# Patient Record
Sex: Female | Born: 1965 | Race: Black or African American | Hispanic: No | Marital: Married | Smoking: Never smoker
Health system: Southern US, Community
[De-identification: ages and names within clinical notes are randomized; demographics above are authoritative.]

## PROBLEM LIST (undated history)

## (undated) DIAGNOSIS — E559 Vitamin D deficiency, unspecified: Secondary | ICD-10-CM

## (undated) DIAGNOSIS — D649 Anemia, unspecified: Secondary | ICD-10-CM

## (undated) DIAGNOSIS — F329 Major depressive disorder, single episode, unspecified: Secondary | ICD-10-CM

## (undated) DIAGNOSIS — J309 Allergic rhinitis, unspecified: Secondary | ICD-10-CM

## (undated) DIAGNOSIS — R7303 Prediabetes: Secondary | ICD-10-CM

## (undated) DIAGNOSIS — E669 Obesity, unspecified: Secondary | ICD-10-CM

## (undated) DIAGNOSIS — F32A Depression, unspecified: Secondary | ICD-10-CM

## (undated) DIAGNOSIS — E785 Hyperlipidemia, unspecified: Secondary | ICD-10-CM

## (undated) HISTORY — DX: Major depressive disorder, single episode, unspecified: F32.9

## (undated) HISTORY — PX: TOOTH EXTRACTION: SUR596

## (undated) HISTORY — DX: Prediabetes: R73.03

## (undated) HISTORY — DX: Vitamin D deficiency, unspecified: E55.9

## (undated) HISTORY — DX: Depression, unspecified: F32.A

## (undated) HISTORY — DX: Hyperlipidemia, unspecified: E78.5

## (undated) HISTORY — DX: Obesity, unspecified: E66.9

## (undated) HISTORY — DX: Anemia, unspecified: D64.9

## (undated) HISTORY — DX: Allergic rhinitis, unspecified: J30.9

---

## 2006-10-26 ENCOUNTER — Ambulatory Visit: Payer: Self-pay | Admitting: Family Medicine

## 2008-10-07 ENCOUNTER — Ambulatory Visit: Payer: Self-pay | Admitting: General Practice

## 2009-03-18 ENCOUNTER — Ambulatory Visit: Payer: Self-pay | Admitting: Family Medicine

## 2010-03-17 ENCOUNTER — Ambulatory Visit: Payer: Self-pay | Admitting: Family Medicine

## 2012-05-11 ENCOUNTER — Ambulatory Visit: Payer: Self-pay | Admitting: Family Medicine

## 2012-07-16 ENCOUNTER — Ambulatory Visit: Payer: Self-pay | Admitting: Family Medicine

## 2012-07-16 DIAGNOSIS — Z0289 Encounter for other administrative examinations: Secondary | ICD-10-CM

## 2012-08-01 ENCOUNTER — Ambulatory Visit: Payer: Self-pay | Admitting: Adult Health

## 2013-02-12 ENCOUNTER — Encounter: Payer: Self-pay | Admitting: *Deleted

## 2013-02-13 ENCOUNTER — Encounter (INDEPENDENT_AMBULATORY_CARE_PROVIDER_SITE_OTHER): Payer: Self-pay

## 2013-02-13 ENCOUNTER — Encounter: Payer: Self-pay | Admitting: Cardiology

## 2013-02-13 ENCOUNTER — Ambulatory Visit (INDEPENDENT_AMBULATORY_CARE_PROVIDER_SITE_OTHER): Payer: PRIVATE HEALTH INSURANCE | Admitting: Cardiology

## 2013-02-13 VITALS — BP 162/111 | HR 83 | Ht 62.0 in | Wt 230.5 lb

## 2013-02-13 DIAGNOSIS — R0989 Other specified symptoms and signs involving the circulatory and respiratory systems: Secondary | ICD-10-CM

## 2013-02-13 DIAGNOSIS — R000 Tachycardia, unspecified: Secondary | ICD-10-CM

## 2013-02-13 DIAGNOSIS — R079 Chest pain, unspecified: Secondary | ICD-10-CM

## 2013-02-13 DIAGNOSIS — R0609 Other forms of dyspnea: Secondary | ICD-10-CM

## 2013-02-13 DIAGNOSIS — R0789 Other chest pain: Secondary | ICD-10-CM

## 2013-02-13 NOTE — Patient Instructions (Signed)
Your physician has requested that you have a stress echocardiogram. For further information please visit [email or web address]. Please follow instruction sheet as given.    Your physician recommends that you schedule a follow-up appointment in:  With Dr. Herbie BaltimoreHarding after your test

## 2013-02-15 ENCOUNTER — Encounter: Payer: Self-pay | Admitting: Cardiology

## 2013-02-15 DIAGNOSIS — R079 Chest pain, unspecified: Secondary | ICD-10-CM | POA: Insufficient documentation

## 2013-02-15 DIAGNOSIS — R0609 Other forms of dyspnea: Secondary | ICD-10-CM | POA: Insufficient documentation

## 2013-02-15 DIAGNOSIS — E661 Drug-induced obesity: Secondary | ICD-10-CM | POA: Insufficient documentation

## 2013-02-15 DIAGNOSIS — R0789 Other chest pain: Secondary | ICD-10-CM | POA: Insufficient documentation

## 2013-02-15 NOTE — Assessment & Plan Note (Addendum)
This is relatively new onset symptom for her. It is questioned with her history of menorrhagia could she potentially be anemic. But with her essentially metabolic syndrome having prediabetes and hypertension as well as obesity, we need to rule out coronary artery disease. She does not have premature family history, and does not smoke.  I would like to get a good assessment of her cardiac function will also be getting assessment of her ischemic potential. With her obesity, and pendulous breast I don't think she would be a candidate for nuclear stress test due to the high likelihood of breast attenuation, loss of concern that she could have lots of artifact on treadmill stress test EKG. Therefore I would like to do so that an imaging as part of stress test and think that date stress echocardiogram would be a good option. Depending on what is seen on the baseline echocardiographic images, we could consider having a full echocardiogram if there is no evidence of ischemia number still looking for a cause of her dyspnea.  Plan: Exercise Treadmill Echocardiogram; depending on the results of this, we may want to consider evaluating for possible  pulmonary emboli.

## 2013-02-15 NOTE — Assessment & Plan Note (Signed)
One major reason for performing this evaluation for ischemia is to potentially provide reassurance that she can indeed go back to exercising. She will need significant weight loss efforts including dietary modifications and exercise.

## 2013-02-15 NOTE — Progress Notes (Signed)
PATIENTNICHELLE Benton MRN: [id number] DOB: [birth year] PCP: Ruthe Mannan, MD  Clinic Note: Chief Complaint  Patient presents with  . OTHER    Self Ref c/o chest tightness, rapid heart beat and sob. Meds reviewed verbally with pt.    HPI: Jasmine Benton is a 48 y.o. female with a PMH below who presents today for self-referral to discuss chest tightness/heaviness as well as dyspnea.  Interval History: She as a history of about 3 weeks of symptoms of progressively increasing dyspnea that is associated with tightness in her chest. She specifically notes that the most notable symptoms included an episode of his cleaning her house this past weekend and felt extremely short of breath after just starting that was associated with a sense of tightness in her chest. She felt like she was having a wheeze. She is sitting down to rest it and made it somewhat better. She's also had this tightness in her chest as well as dyspnea during intercourse. 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 Only a couple occasions has she actually noted a tightness in her chest. When that occurs she also notes a sensation of nausea and that her heart is racing. After she rests all that she feels tired and fatigued but the symptom has resolved with resting. She describes a sensation of tightness or squeezing across her chest pointing to the center of her sternum and radiating out to both sides. She does not have any sensation of palpitations or racing heartbeats except when she is feeling this. She doesn't notice that her heart is going that way for a long period time. She denies any lightheadedness wooziness except when feeling and nausea episode symptoms.No PND, orthopnea or edema. She sleeps a couple pillows because of GERD. She denies any chest tightness or pressure at rest, nor does she have any dyspnea at rest.   No  syncope or near-syncope. No TIA or amaurosis fugax symptoms. No claudication. No melena, hematochezia, or hematuria.  Past Medical History  Diagnosis Date  . Depression   . Allergic rhinitis   . Anemia   . Obesity   . Vitamin D deficiency   . Hyperlipidemia   . Glucose intolerance (pre-diabetes)      with obesity, and hypertension = metabolic syndrome     Prior Cardiac Evaluation and Past Surgical History: History reviewed. No pertinent past surgical history.  Allergies  Allergen Reactions  . Penicillins     Current Outpatient Prescriptions  Medication Sig Dispense Refill  . ALPRAZolam (XANAX) 0.5 MG tablet Take 0.5 mg by mouth at bedtime as needed for anxiety.      . fexofenadine (ALLEGRA) 180 MG tablet Take 180 mg by mouth as needed for allergies or rhinitis.      . MELOXICAM PO Take by mouth as needed.       No current facility-administered medications for this visit.    History   Social History Narrative   She works full-time in for [employer]. She's been there for 15 years. She has one year of college after graduating from high school.   She is married with 2 children, she lives with her husband 2 children and mother-in-law. She exercises roughly twice a week doing walking.    She never smoked, and does not drink alcohol.    family history includes Alzheimer's disease in her  mother; Asthma in her son; Diabetes in her father; Heart attack (age of onset: 870) in her father; Heart attack (age of onset: 2078) in her mother; Heart disease in her mother; Hypertension in her brother and father; Pneumonia in her father; Stroke in her sister.  ROS: A comprehensive Review of Systems - Negative except Chronic mild menorrhagia, GERD, occasional abdominal cramps, but otherwise negative not noted above. No recent illnesses. She does have chronic anxiety.  PHYSICAL EXAM BP 162/111  Pulse 83  Ht 5\' 2"  (1.575 m)  Wt 230 lb 8 oz (104.554 kg)  BMI 42.15 kg/m2 General  appearance: alert, cooperative, appears stated age, no distress, morbidly obese and Very pleasant, healthy appearing. She is well-groomed and well-dressed. She answers questions appropriately. HEENT: [place]/AT, EOMI, MMM, anicteric sclera Neck: no adenopathy, no carotid bruit, no JVD, supple, symmetrical, trachea midline and thyroid not enlarged, symmetric, no tenderness/mass/nodules Lungs: clear to auscultation bilaterally, normal percussion bilaterally and Nonlabored, good air movement Heart: regular rate and rhythm, S1, S2 normal, no murmur, click, rub or gallop and Distant heart sounds, and impossible to feel the apical impulse. Abdomen: soft, non-tender; bowel sounds normal; no masses,  no organomegaly and Obese Extremities: extremities normal, atraumatic, no cyanosis or edema and no ulcers, gangrene or trophic changes Pulses: 2+ and symmetric Neurologic: Alert and oriented X 3, normal strength and tone. Normal symmetric reflexes. Normal coordination and gait  ZOX:[id number]:Performed today: Yes Rate:83 , Rhythm: NSR, axis roughly 90, RSR' in precordial leads  Recent Labs: None available  ASSESSMENT / PLAN: Severely obese relatively young at American woman with glucose intolerance/prediabetes, hyperlipidemia, and hypertension at least by our exam who is now residing with exertional dyspnea and chest tightness.  Exertional dyspnea This is relatively new onset symptom for her. It is questioned with her history of menorrhagia could she potentially be anemic. But with her essentially metabolic syndrome having prediabetes and hypertension as well as obesity, we need to rule out coronary artery disease. She does not have premature family history, and does not smoke.  I would like to get a good assessment of her cardiac function will also be getting assessment of her ischemic potential. With her obesity, and pendulous breast I don't think she would be a candidate for nuclear stress test due to the high  likelihood of breast attenuation, loss of concern that she could have lots of artifact on treadmill stress test EKG. Therefore I would like to do so that an imaging as part of stress test and think that date stress echocardiogram would be a good option. Depending on what is seen on the baseline echocardiographic images, we could consider having a full echocardiogram if there is no evidence of ischemia number still looking for a cause of her dyspnea.  Plan: Exercise Treadmill Echocardiogram; depending on the results of this, we may want to consider evaluating for possible  pulmonary emboli.  Chest pain with moderate risk for cardiac etiology More concerning than the dyspnea is that now she is also having intermittent chest pressure as well as the dyspnea which does tend to increase my suspicion for a possible cardiac etiology.  Some of it could very well be musculoskeletal, but still need an ischemic evaluation.  Plan: Treadmill Echocardiogram  Severe obesity (BMI >= 40) One major reason for performing this evaluation for ischemia is to potentially provide reassurance that she can indeed go back to exercising. She will need significant weight loss efforts including dietary modifications and exercise.    Orders Placed  This Encounter  Procedures  . EKG 12-Lead    Order Specific Question:  Where should this test be performed    Answer:  [facility]  . Echocardiogram stress test without contrast    Standing Status: Future     Number of Occurrences:      Standing Expiration Date: 02/13/2014    Order Specific Question:  Type of Echo    Answer:  Complete    Order Specific Question:  Where should this test be performed    Answer:  [facility]    Order Specific Question:  Reason for exam-Echo    Answer:  Chest Pain  786.50    Order Specific Question:  Reason for exam-Echo    Answer:  Dyspnea  786.09   Meds ordered this encounter  Medications  . MELOXICAM PO    Sig: Take by mouth as  needed.    Followup: 1  months  DAVID W. Herbie Baltimore, M.D., M.S. Interventional Cardiolgy [facility]

## 2013-02-15 NOTE — Assessment & Plan Note (Signed)
More concerning than the dyspnea is that now she is also having intermittent chest pressure as well as the dyspnea which does tend to increase my suspicion for a possible cardiac etiology.  Some of it could very well be musculoskeletal, but still need an ischemic evaluation.  Plan: Treadmill Echocardiogram

## 2013-02-26 ENCOUNTER — Other Ambulatory Visit: Payer: PRIVATE HEALTH INSURANCE

## 2013-03-01 ENCOUNTER — Other Ambulatory Visit: Payer: PRIVATE HEALTH INSURANCE

## 2013-03-06 ENCOUNTER — Ambulatory Visit: Payer: PRIVATE HEALTH INSURANCE | Admitting: Cardiology

## 2013-03-12 ENCOUNTER — Other Ambulatory Visit (INDEPENDENT_AMBULATORY_CARE_PROVIDER_SITE_OTHER): Payer: PRIVATE HEALTH INSURANCE

## 2013-03-12 DIAGNOSIS — R0609 Other forms of dyspnea: Secondary | ICD-10-CM

## 2013-03-12 DIAGNOSIS — R079 Chest pain, unspecified: Secondary | ICD-10-CM

## 2013-03-12 DIAGNOSIS — R0989 Other specified symptoms and signs involving the circulatory and respiratory systems: Secondary | ICD-10-CM

## 2013-03-12 HISTORY — PX: OTHER SURGICAL HISTORY: SHX169

## 2013-03-13 NOTE — Progress Notes (Signed)
Not unless she still has ??s.  Jasmine Benton,Jasmine Macwilliams W, MD

## 2013-03-21 ENCOUNTER — Ambulatory Visit (INDEPENDENT_AMBULATORY_CARE_PROVIDER_SITE_OTHER): Payer: PRIVATE HEALTH INSURANCE | Admitting: Family Medicine

## 2013-03-21 ENCOUNTER — Other Ambulatory Visit (HOSPITAL_COMMUNITY)
Admission: RE | Admit: 2013-03-21 | Discharge: 2013-03-21 | Disposition: A | Discharge status: Home / Self Care | Payer: PRIVATE HEALTH INSURANCE | Source: Ambulatory Visit | Attending: Family Medicine | Admitting: Family Medicine

## 2013-03-21 ENCOUNTER — Encounter: Payer: Self-pay | Admitting: Family Medicine

## 2013-03-21 VITALS — BP 182/117 | HR 70 | Temp 97.6°F | Ht 62.0 in | Wt 230.5 lb

## 2013-03-21 DIAGNOSIS — R0609 Other forms of dyspnea: Secondary | ICD-10-CM

## 2013-03-21 DIAGNOSIS — N760 Acute vaginitis: Secondary | ICD-10-CM | POA: Insufficient documentation

## 2013-03-21 DIAGNOSIS — Z1151 Encounter for screening for human papillomavirus (HPV): Secondary | ICD-10-CM | POA: Insufficient documentation

## 2013-03-21 DIAGNOSIS — Z01419 Encounter for gynecological examination (general) (routine) without abnormal findings: Secondary | ICD-10-CM

## 2013-03-21 DIAGNOSIS — R079 Chest pain, unspecified: Secondary | ICD-10-CM

## 2013-03-21 DIAGNOSIS — R0989 Other specified symptoms and signs involving the circulatory and respiratory systems: Secondary | ICD-10-CM

## 2013-03-21 DIAGNOSIS — Z113 Encounter for screening for infections with a predominantly sexual mode of transmission: Secondary | ICD-10-CM | POA: Insufficient documentation

## 2013-03-21 DIAGNOSIS — Z1231 Encounter for screening mammogram for malignant neoplasm of breast: Secondary | ICD-10-CM

## 2013-03-21 DIAGNOSIS — I10 Essential (primary) hypertension: Secondary | ICD-10-CM

## 2013-03-21 DIAGNOSIS — Z136 Encounter for screening for cardiovascular disorders: Secondary | ICD-10-CM

## 2013-03-21 DIAGNOSIS — Z0000 Encounter for general adult medical examination without abnormal findings: Secondary | ICD-10-CM | POA: Insufficient documentation

## 2013-03-21 LAB — LIPID PANEL
Cholesterol: 172 mg/dL (ref 0–200)
HDL: 61.6 mg/dL (ref >39.00)
LDL Cholesterol: 93 mg/dL (ref 0–99)
Total CHOL/HDL Ratio: 3
Triglycerides: 85 mg/dL (ref 0.0–149.0)
VLDL: 17 mg/dL (ref 0.0–40.0)

## 2013-03-21 LAB — CBC WITH DIFFERENTIAL/PLATELET
BASOS PCT: 0.4 % (ref 0.0–3.0)
Basophils Absolute: 0 10*3/uL (ref 0.0–0.1)
EOS ABS: 0.3 10*3/uL (ref 0.0–0.7)
EOS PCT: 3.7 % (ref 0.0–5.0)
HCT: 39.3 % (ref 36.0–46.0)
Hemoglobin: 12.8 g/dL (ref 12.0–15.0)
Lymphocytes Relative: 37.8 % (ref 12.0–46.0)
Lymphs Abs: 3.2 10*3/uL (ref 0.7–4.0)
MCHC: 32.5 g/dL (ref 30.0–36.0)
MCV: 86.8 fl (ref 78.0–100.0)
MONO ABS: 0.5 10*3/uL (ref 0.1–1.0)
Monocytes Relative: 6.1 % (ref 3.0–12.0)
NEUTROS ABS: 4.4 10*3/uL (ref 1.4–7.7)
NEUTROS PCT: 52 % (ref 43.0–77.0)
Platelets: 255 10*3/uL (ref 150.0–400.0)
RBC: 4.52 Mil/uL (ref 3.87–5.11)
RDW: 14.7 % — ABNORMAL HIGH (ref 11.5–14.6)
WBC: 8.4 10*3/uL (ref 4.5–10.5)

## 2013-03-21 LAB — COMPREHENSIVE METABOLIC PANEL
ALT: 15 U/L (ref 0–35)
AST: 17 U/L (ref 0–37)
Albumin: 3.7 g/dL (ref 3.5–5.2)
Alkaline Phosphatase: 70 U/L (ref 39–117)
BILIRUBIN TOTAL: 0.4 mg/dL (ref 0.3–1.2)
BUN: 11 mg/dL (ref 6–23)
CALCIUM: 8.6 mg/dL (ref 8.4–10.5)
CHLORIDE: 106 meq/L (ref 96–112)
CO2: 23 meq/L (ref 19–32)
Creatinine, Ser: 0.8 mg/dL (ref 0.4–1.2)
GFR: 96.25 mL/min (ref >60.00)
GLUCOSE: 100 mg/dL — AB (ref 70–99)
Potassium: 3.8 mEq/L (ref 3.5–5.1)
SODIUM: 139 meq/L (ref 135–145)
TOTAL PROTEIN: 7.2 g/dL (ref 6.0–8.3)

## 2013-03-21 LAB — TSH: TSH: 1.75 u[IU]/mL (ref 0.35–5.50)

## 2013-03-21 MED ORDER — LISINOPRIL-HYDROCHLOROTHIAZIDE 10-12.5 MG PO TABS: 1.0000 | ORAL_TABLET | Freq: Every day | ORAL | Status: DC

## 2013-03-21 NOTE — Assessment & Plan Note (Signed)
Extremely high today with 2 documented elevated readings. Will start lisinopril- HCTZ as she will likely need two agents. She will use her husband's BP machine to check her blood pressures. Check labs today. Follow up in 2 weeks.

## 2013-03-21 NOTE — Assessment & Plan Note (Signed)
Discussed that I cannot safely prescribe appetite suppressant due to her high blood pressure. She did agree to nutritionist referral- referral placed.

## 2013-03-21 NOTE — Assessment & Plan Note (Signed)
Pap smear today. Mammogram ordered. 

## 2013-03-21 NOTE — Assessment & Plan Note (Signed)
Reviewed preventive care protocols, scheduled due services, and updated immunizations Discussed nutrition, exercise, diet, and healthy lifestyle.  Orders Placed This Encounter  Procedures  . MM Digital Screening  . Comprehensive metabolic panel  . Lipid panel  . CBC with Differential  . TSH  . Amb ref to Medical Nutrition Therapy-MNT

## 2013-03-21 NOTE — Patient Instructions (Addendum)
It is so nice to meet you.  We are starting lisinopril-HCTZ- please come see me in two weeks.  Please call to set up your mammogram.  We will call you with your nutritionist referral.  We will also call you with your lab results and you may view them online.

## 2013-03-21 NOTE — Progress Notes (Signed)
Pre visit review using our clinic review tool, if applicable. No additional management support is needed unless otherwise documented below in the visit note. 

## 2013-03-21 NOTE — Progress Notes (Signed)
Subjective:   Patient ID: Jasmine Benton, female    DOB: Oct 31, 1965, 48 y.o.   MRN: [id number]  Jasmine Benton is a pleasant 48 y.o. year old female who presents to clinic today with Establish Care, Weight Loss and Chest Pain  on 03/21/2013  HPI:  G2P2- no h/o abnormal pap smears in past 5 years. Per pt, she is "overdue" for pap smear but cannot remember exactly when she had it done. No family h/o breast, uterine, cervical or ovarian CA. Due for mammogram.  HTN- does have family history of high blood pressure.  Has never been on any antihypertensives.  Denies any HA or blurred vision.  She has been experiencing DOE and CP and saw Dr. Herbie Baltimore (cardiology).  BP was elevated at that office visit as well. BP Readings from Last 3 Encounters:  03/21/13 182/117  02/13/13 162/111    Strong FH of heart disease. 03/12/2012- neg echo stress test-  Decent exercise level. No EKG changes. Echocardiogram at rest looks normal, with no significant changes during exertion to suggest heart artery disease.   Obesity- has tried several diets, most recently Weight watchers.  Trying to walk more.  Has thought about bariatric surgery but her insurance will not cover it.  Patient Active Problem List   Diagnosis Date Noted  . Encounter for routine gynecological examination 03/21/2013  . Routine general medical examination at a health care facility 03/21/2013  . HTN (hypertension) 03/21/2013  . Severe obesity (BMI >= 40) 02/15/2013    Class: Chronic  . Chest tightness 02/15/2013  . Exertional dyspnea 02/15/2013  . Chest pain with moderate risk for cardiac etiology 02/15/2013   Past Medical History  Diagnosis Date  . Depression   . Allergic rhinitis   . Anemia   . Obesity   . Vitamin D deficiency   . Hyperlipidemia   . Glucose intolerance (pre-diabetes)      with obesity, and hypertension = metabolic syndrome    Past Surgical History  Procedure Laterality Date  . Tooth extraction     History    Substance Use Topics  . Smoking status: Never Smoker   . Smokeless tobacco: Never Used  . Alcohol Use: Yes   Family History  Problem Relation Age of Onset  . Alzheimer's disease Mother   . Heart disease Mother   . Heart attack Mother 54  . Diabetes Father     Type II  . Hypertension Father   . Pneumonia Father   . Heart attack Father 61  . Hypertension Brother   . Asthma Son    Allergies  Allergen Reactions  . Penicillins    Current Outpatient Prescriptions on File Prior to Visit  Medication Sig Dispense Refill  . ALPRAZolam (XANAX) 0.5 MG tablet Take 0.5 mg by mouth at bedtime as needed for anxiety.      . fexofenadine (ALLEGRA) 180 MG tablet Take 180 mg by mouth as needed for allergies or rhinitis.      . MELOXICAM PO Take by mouth as needed.       No current facility-administered medications on file prior to visit.   The PMH, PSH, Social History, Family History, Medications, and allergies have been reviewed in [facility], and have been updated if relevant.   Review of Systems    See HPI Denies blood in stool No changes in bowel habits Denies anxiety or depression- does have stressful job with [employer] but feels she is coping ok.  Has supportive husband  Objective:  BP 182/117  Pulse 70  Temp(Src) 97.6 F (36.4 C) (Oral)  Ht 5\' 2"  (1.575 m)  Wt 230 lb 8 oz (104.554 kg)  BMI 42.15 kg/m2  SpO2 98%  LMP 03/02/2013   Physical Exam  General:  obese,well-nourished,in no acute distress; alert,appropriate and cooperative throughout examination Head:  normocephalic and atraumatic.   Eyes:  vision grossly intact, pupils equal, pupils round, and pupils reactive to light.   Ears:  R ear normal and L ear normal.   Nose:  no external deformity.   Mouth:  good dentition.   Neck:  No deformities, masses, or tenderness noted. Breasts:  No mass, nodules, thickening, tenderness, bulging, retraction, inflamation, nipple discharge or skin changes noted.   Lungs:  Normal  respiratory effort, chest expands symmetrically. Lungs are clear to auscultation, no crackles or wheezes. Heart:  Normal rate and regular rhythm. S1 and S2 normal without gallop, murmur, click, rub or other extra sounds. Abdomen:  Bowel sounds positive,abdomen soft and non-tender without masses, organomegaly or hernias noted. Rectal:  no external abnormalities.   Genitalia:  Pelvic Exam:        External: normal female genitalia without lesions or masses        Vagina: normal without lesions or masses        Cervix: normal without lesions or masses        Adnexa: normal bimanual exam without masses or fullness        Uterus: normal by palpation        Pap smear: performed Msk:  No deformity or scoliosis noted of thoracic or lumbar spine.   Extremities:  No clubbing, cyanosis, edema, or deformity noted with normal full range of motion of all joints.   Neurologic:  alert & oriented X3 and gait normal.   Skin:  Intact without suspicious lesions or rashes Cervical Nodes:  No lymphadenopathy noted Axillary Nodes:  No palpable lymphadenopathy Psych:  Cognition and judgment appear intact. Alert and cooperative with normal attention span and concentration. No apparent delusions, illusions, hallucinations        Assessment & Plan:   Severe obesity (BMI >= 40)  Chest pain with moderate risk for cardiac etiology  Exertional dyspnea  Encounter for routine gynecological examination  Routine general medical examination at a health care facility No Follow-up on file.

## 2013-03-22 ENCOUNTER — Telehealth: Payer: Self-pay | Admitting: Family Medicine

## 2013-03-22 ENCOUNTER — Encounter: Payer: Self-pay | Admitting: *Deleted

## 2013-03-22 NOTE — Telephone Encounter (Signed)
Relevant patient education assigned to patient using Emmi. ° °

## 2013-03-27 ENCOUNTER — Ambulatory Visit: Payer: PRIVATE HEALTH INSURANCE | Admitting: Cardiology

## 2013-03-27 ENCOUNTER — Encounter: Payer: Self-pay | Admitting: *Deleted

## 2013-04-04 ENCOUNTER — Ambulatory Visit (INDEPENDENT_AMBULATORY_CARE_PROVIDER_SITE_OTHER): Payer: PRIVATE HEALTH INSURANCE | Admitting: Family Medicine

## 2013-04-04 ENCOUNTER — Encounter: Payer: Self-pay | Admitting: Family Medicine

## 2013-04-04 VITALS — BP 128/78 | HR 57 | Temp 97.9°F | Wt 227.0 lb

## 2013-04-04 DIAGNOSIS — I10 Essential (primary) hypertension: Secondary | ICD-10-CM

## 2013-04-04 NOTE — Progress Notes (Signed)
Subjective:   Patient ID: Jasmine Benton, female    DOB: February 07, 1965, 48 y.o.   MRN: [id number]  Jasmine Benton is a pleasant 48 y.o. year old female who presents to clinic today with Follow-up  on 04/04/2013  HPI: HTN- does have family history of high blood pressure.   When I saw her on 3/19, had multiple elevated BP readings- here and at cardiology.  I started her on lisinopril-HCTZ 10-12.5 mg daily.  Initially felt nauseated when she started it, feeling better this week.   Denies any HA or blurred vision.  She had been experiencing DOE and CP and saw Dr. Herbie BaltimoreHarding (cardiology).   BP Readings from Last 3 Encounters:  04/04/13 128/78  03/21/13 182/117  02/13/13 162/111   Has started exercising.  Referred to nutrition at last office visit but they have not contacted her yet. She has already lost 3 pounds.  Wt Readings from Last 3 Encounters:  04/04/13 227 lb (102.967 kg)  03/21/13 230 lb 8 oz (104.554 kg)  02/13/13 230 lb 8 oz (104.554 kg)      Patient Active Problem List   Diagnosis Date Noted  . Encounter for routine gynecological examination 03/21/2013  . Routine general medical examination at a health care facility 03/21/2013  . HTN (hypertension) 03/21/2013  . Severe obesity (BMI >= 40) 02/15/2013    Class: Chronic  . Chest tightness 02/15/2013  . Exertional dyspnea 02/15/2013  . Chest pain with moderate risk for cardiac etiology 02/15/2013   Past Medical History  Diagnosis Date  . Depression   . Allergic rhinitis   . Anemia   . Obesity   . Vitamin D deficiency   . Hyperlipidemia   . Glucose intolerance (pre-diabetes)      with obesity, and hypertension = metabolic syndrome    Past Surgical History  Procedure Laterality Date  . Tooth extraction     History  Substance Use Topics  . Smoking status: Never Smoker   . Smokeless tobacco: Never Used  . Alcohol Use: Yes   Family History  Problem Relation Age of Onset  . Alzheimer's disease Mother   . Heart  disease Mother   . Heart attack Mother 7078  . Diabetes Father     Type II  . Hypertension Father   . Pneumonia Father   . Heart attack Father 4170  . Hypertension Brother   . Asthma Son    Allergies  Allergen Reactions  . Penicillins    Current Outpatient Prescriptions on File Prior to Visit  Medication Sig Dispense Refill  . ALPRAZolam (XANAX) 0.5 MG tablet Take 0.5 mg by mouth at bedtime as needed for anxiety.      . fexofenadine (ALLEGRA) 180 MG tablet Take 180 mg by mouth as needed for allergies or rhinitis.      Marland Kitchen. lisinopril-hydrochlorothiazide (PRINZIDE,ZESTORETIC) 10-12.5 MG per tablet Take 1 tablet by mouth daily.  90 tablet  3  . MELOXICAM PO Take by mouth as needed.       No current facility-administered medications on file prior to visit.   The PMH, PSH, Social History, Family History, Medications, and allergies [facility], and have been updated if relevant.   Review of Systems    See HPI No HA No blurred vision   Objective:    BP 128/78  Pulse 57  Temp(Src) 97.9 F (36.6 C) (Oral)  Wt 227 lb (102.967 kg)  SpO2 99%  LMP 03/01/2013  Wt Readings from  Last 3 Encounters:  04/04/13 227 lb (102.967 kg)  03/21/13 230 lb 8 oz (104.554 kg)  02/13/13 230 lb 8 oz (104.554 kg)     Physical Exam  General:  obese,well-nourished,in no acute distress; alert,appropriate and cooperative throughout examination Lungs:  Normal respiratory effort, chest expands symmetrically. Lungs are clear to auscultation, no crackles or wheezes. Heart:  Normal rate and regular rhythm. S1 and S2 normal without gallop, murmur, click, rub or other extra sounds. Psych:  Cognition and judgment appear intact. Alert and cooperative with normal attention span and concentration. No apparent delusions, illusions, hallucinations Ext:  No edema        Assessment & Plan:   HTN (hypertension) No Follow-up on file.

## 2013-04-04 NOTE — Progress Notes (Signed)
Pre visit review using our clinic review tool, if applicable. No additional management support is needed unless otherwise documented below in the visit note. 

## 2013-04-04 NOTE — Assessment & Plan Note (Signed)
Well controlled on current rx. No changes. Follow up as needed. The patient indicates understanding of these issues and agrees with the plan.

## 2013-04-04 NOTE — Patient Instructions (Signed)
Good to see you. Your blood pressure looks fantastic.  You look great!  We will call you about your nutritionist referral.

## 2013-04-10 ENCOUNTER — Ambulatory Visit: Payer: PRIVATE HEALTH INSURANCE | Admitting: Cardiology

## 2013-05-08 ENCOUNTER — Ambulatory Visit (INDEPENDENT_AMBULATORY_CARE_PROVIDER_SITE_OTHER): Payer: PRIVATE HEALTH INSURANCE | Admitting: Cardiology

## 2013-05-08 ENCOUNTER — Encounter: Payer: Self-pay | Admitting: Cardiology

## 2013-05-08 VITALS — BP 112/82 | HR 68 | Ht 62.0 in | Wt 229.2 lb

## 2013-05-08 DIAGNOSIS — R079 Chest pain, unspecified: Secondary | ICD-10-CM

## 2013-05-08 DIAGNOSIS — R0989 Other specified symptoms and signs involving the circulatory and respiratory systems: Secondary | ICD-10-CM

## 2013-05-08 DIAGNOSIS — R0609 Other forms of dyspnea: Secondary | ICD-10-CM

## 2013-05-08 NOTE — Progress Notes (Signed)
Jasmine Benton: Coletta E Groleau MRN: [id number] DOB: 07-25-1965 PCP: Ruthe Mannanalia Aron, MD  Clinic Note: Chief Complaint  Patient presents with  . OTHER    F/u from stress test c/o sob with exertion. Meds reviewed verbally with pt.    HPI: Jasmine Fothergillam E Goshorn is a 48 y.o. female with a PMH below who presents today for followup for results of her Exercise Echocardiogram Stress Test. I first saw her in February as a self-referral to discuss chest tightness/heaviness as well as dyspnea.  At that time she noted a three-week history of progressively exertional dyspnea as well as chest pressure/tightness. The initial episode was while cleaning her house -- she started feeling a tightness in her chest and dyspnea. She also noted dyspnea with intercourse and walking across a parking lot. Not many of these were associated with chest pressure. The test did not show any suggestion of dyspnea were abnormal cardiac function. Normal EF. She was started on Zestoretic for hypertension by her PCP. Since then she has she notes feeling better with less dyspnea.  Interval History: Since her stress test and last visit, she really has been doing relatively fine. She still has exertional dyspnea, but has started exercising and says it is getting better. He is not having anymore the chest pressure with exertion. No PND, orthopnea or edema. No rapid or irregular heartbeat/palpitations. No syncope or near-syncope. No TIA or amaurosis fugax symptoms. No claudication. No melena, hematochezia, or hematuria.  Past Medical History  Diagnosis Date  . Depression   . Allergic rhinitis   . Anemia   . Obesity   . Vitamin D deficiency   . Hyperlipidemia   . Glucose intolerance (pre-diabetes)      with obesity, and hypertension = metabolic syndrome     Prior Cardiac Evaluation and Past Surgical History: Past Surgical History  Procedure Laterality Date  . Tooth extraction    . Exercise stress echo  03/12/2013    Normal Function; No ischemia or  infarction.    Allergies  Allergen Reactions  . Penicillins     Current Outpatient Prescriptions  Medication Sig Dispense Refill  . ALPRAZolam (XANAX) 0.5 MG tablet Take 0.5 mg by mouth at bedtime as needed for anxiety.      . fexofenadine (ALLEGRA) 180 MG tablet Take 180 mg by mouth as needed for allergies or rhinitis.      Marland Kitchen. lisinopril-hydrochlorothiazide (PRINZIDE,ZESTORETIC) 10-12.5 MG per tablet Take 1 tablet by mouth daily.  90 tablet  3  . MELOXICAM PO Take by mouth as needed.       No current facility-administered medications for this visit.    Social and Family History Reviewed in [facility]. No notable changes   ROS: A comprehensive Review of Systems - essentially negative with no active symptoms besides abdominal cramping and mild menorrhagia.  PHYSICAL EXAM BP 112/82  Pulse 68  Ht 5\' 2"  (1.575 m)  Wt 229 lb 4 oz (103.987 kg)  BMI 41.92 kg/m2 General appearance: a&ox3, pleasant mood and affect. Morbidly obese; well groomed HEENT: [place]/AT, EOMI, MMM, anicteric sclera Neck: no adenopathy, no carotid bruit, no JVD, supple,  Lungs: clear to auscultation bilaterally, normal percussion bilaterally and Nonlabored, good air movement Heart: RRR, S1, S2 distant but normal, impossible to feel the apical impulse. No obvious M./R./G. Abdomen: soft, non-tender; bowel sounds normal; no masses,  no organomegaly and Obese Extremities: extremities normal, atraumatic, no cyanosis or edema and no ulcers, gangrene or trophic changes Pulses: 2+ and symmetric Neurologic: Grossly  normal  ZOX:[id number]:Performed today: No  Recent Labs: None available  ASSESSMENT / PLAN: Severely obese relatively young at American woman with glucose intolerance/prediabetes, hyperlipidemia, and hypertension following up on her Exercise Echocardogram Stress Test.   Chest pain with moderate risk for cardiac etiology Exercise Echo Stress Test showed relatively normal baseline echocardiogram and normal function and valves.  No change in wall motion. No further chest pain just simply exertional dyspnea. Unfortunately, we do not have full rebound echocardiogram to suggest any signs of diastolic dysfunction, but with her symptoms improving after initiation of ACE inhibitor with diuretic combination, I would suspect she probably does have some complement of diastolic dysfunction that can be contributing to her exertional dyspnea.  At this point, looking at her chest discomfort he had was probably more likely related to breathing issues as opposed to an anginal equivalent.  Exertional dyspnea Probably mostly related to her obesity and possibly combined with allergies since the initial episode began with cleaning her house. She has a reactive airways component. She actually did relatively well on the treadmill and was able to get her heart rate to 90% of predicted without having chest tightness and significant dyspnea. Certainly her exercises tolerance is reduced, but not unexpected be due to her body habitus/obesity.  Unlikely to be ischemic based on the results of her Exercise Echocardiogram Stress Test.  Severe obesity (BMI >= 40) Has been referred for nutrition consultation. She is also increased her exercise level will help to lose weight. The results of her stress test being negative for ischemia hence bolstered her confidence in going forward with exercise.    No orders of the defined types were placed in this encounter.   No orders of the defined types were placed in this encounter.    Followup: As she does not have significant cardiac issues, I think her hypertension can be monitored by her primary physician who initiated a successful antihypertensive regimen. I think she can simply follow-up with us on a PRN basis.  DAVID W. Herbie BaltimoreHARDING, M.D., M.S. Interventional Cardiolgy [facility]

## 2013-05-08 NOTE — Patient Instructions (Signed)
Your physician recommends that you schedule a follow-up appointment in:  As needed  Your physician recommends that you continue on your current medications as directed. Please refer to the Current Medication list given to you today.  

## 2013-05-09 NOTE — Assessment & Plan Note (Signed)
Probably mostly related to her obesity and possibly combined with allergies since the initial episode began with cleaning her house. She has a reactive airways component. She actually did relatively well on the treadmill and was able to get her heart rate to 90% of predicted without having chest tightness and significant dyspnea. Certainly her exercises tolerance is reduced, but not unexpected be due to her body habitus/obesity.  Unlikely to be ischemic based on the results of her Exercise Echocardiogram Stress Test.

## 2013-05-09 NOTE — Assessment & Plan Note (Signed)
Has been referred for nutrition consultation. She is also increased her exercise level will help to lose weight. The results of her stress test being negative for ischemia hence bolstered her confidence in going forward with exercise.

## 2013-05-09 NOTE — Assessment & Plan Note (Signed)
Exercise Echo Stress Test showed relatively normal baseline echocardiogram and normal function and valves. No change in wall motion. No further chest pain just simply exertional dyspnea. Unfortunately, we do not have full rebound echocardiogram to suggest any signs of diastolic dysfunction, but with her symptoms improving after initiation of ACE inhibitor with diuretic combination, I would suspect she probably does have some complement of diastolic dysfunction that can be contributing to her exertional dyspnea.  At this point, looking at her chest discomfort he had was probably more likely related to breathing issues as opposed to an anginal equivalent.

## 2013-06-07 ENCOUNTER — Encounter: Payer: Self-pay | Admitting: Family Medicine

## 2013-06-07 ENCOUNTER — Ambulatory Visit (INDEPENDENT_AMBULATORY_CARE_PROVIDER_SITE_OTHER): Payer: PRIVATE HEALTH INSURANCE | Admitting: Family Medicine

## 2013-06-07 VITALS — BP 130/74 | HR 67 | Temp 97.3°F | Wt 226.2 lb

## 2013-06-07 DIAGNOSIS — N912 Amenorrhea, unspecified: Secondary | ICD-10-CM

## 2013-06-07 LAB — FOLLICLE STIMULATING HORMONE: FSH: 2.3 m[IU]/mL

## 2013-06-07 LAB — LUTEINIZING HORMONE: LH: 2.43 m[IU]/mL

## 2013-06-07 LAB — HCG, QUANTITATIVE, PREGNANCY: hCG, Beta Chain, Quant, S: 0.81 m[IU]/mL

## 2013-06-07 NOTE — Patient Instructions (Signed)
Good to see you. Have a great weekend! We will call you with your lab results.

## 2013-06-07 NOTE — Assessment & Plan Note (Signed)
?  perimenopause. Check hcg quant to rule out pregnacy. Will check LH/FSH- pt informed this may not provide [place] with any answers at this point.

## 2013-06-07 NOTE — Progress Notes (Signed)
Pre visit review using our clinic review tool, if applicable. No additional management support is needed unless otherwise documented below in the visit note. 

## 2013-06-07 NOTE — Progress Notes (Signed)
Subjective:   Patient ID: Jasmine Benton, female    DOB: 07/17/1965, 48 y.o.   MRN: [id number]  Jasmine Benton is a pleasant 48 y.o. year old female who presents to clinic today with Amenorrhea  on 06/07/2013  HPI: LMP 4/26- skipped "all of May."  Has never missed a period. She is sexually active. Has taken two urine pregnancy test that were neg- most recently two days ago.  No breast tenderness or nausea. Did have some "pimples and other premenstrual symptoms"when she was expecting her period in May.  But that resolved.  +hot flashes  Mom was in probably in late 7540s or early 4750s when she reached menopause.  Current Outpatient Prescriptions on File Prior to Visit  Medication Sig Dispense Refill  . ALPRAZolam (XANAX) 0.5 MG tablet Take 0.5 mg by mouth at bedtime as needed for anxiety.      . fexofenadine (ALLEGRA) 180 MG tablet Take 180 mg by mouth as needed for allergies or rhinitis.      Marland Kitchen. lisinopril-hydrochlorothiazide (PRINZIDE,ZESTORETIC) 10-12.5 MG per tablet Take 1 tablet by mouth daily.  90 tablet  3  . MELOXICAM PO Take by mouth as needed.       No current facility-administered medications on file prior to visit.    Allergies  Allergen Reactions  . Penicillins     Past Medical History  Diagnosis Date  . Depression   . Allergic rhinitis   . Anemia   . Obesity   . Vitamin D deficiency   . Hyperlipidemia   . Glucose intolerance (pre-diabetes)      with obesity, and hypertension = metabolic syndrome     Past Surgical History  Procedure Laterality Date  . Tooth extraction    . Exercise stress echo  03/12/2013    Normal Function; No ischemia or infarction.    Family History  Problem Relation Age of Onset  . Alzheimer's disease Mother   . Heart disease Mother   . Heart attack Mother [id number]  . Diabetes Father     Type II  . Hypertension Father   . Pneumonia Father   . Heart attack Father [id number]  . Hypertension Brother   . Asthma Son     History   Social History    . Marital Status: Married    Spouse Name: N/A    Number of Children: N/A  . Years of Education: N/A   Occupational History  . Not on file.   Social History Main Topics  . Smoking status: Never Smoker   . Smokeless tobacco: Never Used  . Alcohol Use: Yes  . Drug Use: No  . Sexual Activity: Yes   Other Topics Concern  . Not on file   Social History Narrative   She works full-time in for [employer]. She's been there for 15 years. She has one year of college after graduating from high school.   She is married with 2 children, she lives with her husband 2 children and mother-in-law. She exercises roughly twice a week doing walking.    She never smoked, and does not drink alcohol.   The PMH, PSH, Social History, Family History, Medications, and allergies [facility], and have been updated if relevant.   Review of Systems    See HPI No vaginal spotting Objective:    BP 130/74  Pulse 67  Temp(Src) 97.3 F (36.3 C) (Oral)  Wt 226 lb 4 oz (102.626 kg)  SpO2 98%  LMP 04/27/2013   Physical Exam Gen:  Alert, pleasant, NAD Psych: good eye contact, not anxious or depressed appearing.       Assessment & Plan:   Amenorrhea - Plan: Luteinizing hormone, Follicle Stimulating Hormone, hCG, quantitative, pregnancy No Follow-up on file.

## 2013-09-10 ENCOUNTER — Other Ambulatory Visit: Payer: Self-pay | Admitting: Family Medicine

## 2013-11-11 ENCOUNTER — Ambulatory Visit (INDEPENDENT_AMBULATORY_CARE_PROVIDER_SITE_OTHER): Payer: PRIVATE HEALTH INSURANCE | Admitting: Family Medicine

## 2013-11-11 ENCOUNTER — Encounter: Payer: Self-pay | Admitting: Family Medicine

## 2013-11-11 VITALS — BP 128/74 | HR 68 | Temp 97.3°F | Wt 224.8 lb

## 2013-11-11 DIAGNOSIS — R1011 Right upper quadrant pain: Secondary | ICD-10-CM | POA: Insufficient documentation

## 2013-11-11 LAB — COMPREHENSIVE METABOLIC PANEL
ALBUMIN: 3 g/dL — AB (ref 3.5–5.2)
ALT: 15 U/L (ref 0–35)
AST: 14 U/L (ref 0–37)
Alkaline Phosphatase: 55 U/L (ref 39–117)
BUN: 13 mg/dL (ref 6–23)
CHLORIDE: 105 meq/L (ref 96–112)
CO2: 24 mEq/L (ref 19–32)
Calcium: 8.7 mg/dL (ref 8.4–10.5)
Creatinine, Ser: 1 mg/dL (ref 0.4–1.2)
GFR: 74.61 mL/min (ref >60.00)
Glucose, Bld: 87 mg/dL (ref 70–99)
Potassium: 3.3 mEq/L — ABNORMAL LOW (ref 3.5–5.1)
Sodium: 140 mEq/L (ref 135–145)
TOTAL PROTEIN: 7.1 g/dL (ref 6.0–8.3)
Total Bilirubin: 0.6 mg/dL (ref 0.2–1.2)

## 2013-11-11 LAB — CBC WITH DIFFERENTIAL/PLATELET
Basophils Absolute: 0 10*3/uL (ref 0.0–0.1)
Basophils Relative: 0.4 % (ref 0.0–3.0)
Eosinophils Absolute: 0.3 10*3/uL (ref 0.0–0.7)
Eosinophils Relative: 3.5 % (ref 0.0–5.0)
HCT: 39.5 % (ref 36.0–46.0)
HEMOGLOBIN: 12.9 g/dL (ref 12.0–15.0)
Lymphocytes Relative: 33.5 % (ref 12.0–46.0)
Lymphs Abs: 2.8 10*3/uL (ref 0.7–4.0)
MCHC: 32.6 g/dL (ref 30.0–36.0)
MCV: 86.3 fl (ref 78.0–100.0)
Monocytes Absolute: 0.6 10*3/uL (ref 0.1–1.0)
Monocytes Relative: 7.1 % (ref 3.0–12.0)
NEUTROS ABS: 4.6 10*3/uL (ref 1.4–7.7)
Neutrophils Relative %: 55.5 % (ref 43.0–77.0)
Platelets: 241 10*3/uL (ref 150.0–400.0)
RBC: 4.57 Mil/uL (ref 3.87–5.11)
RDW: 14.3 % (ref 11.5–15.5)
WBC: 8.3 10*3/uL (ref 4.0–10.5)

## 2013-11-11 LAB — LIPASE: Lipase: 22 U/L (ref 11.0–59.0)

## 2013-11-11 NOTE — Progress Notes (Signed)
Pre visit review using our clinic review tool, if applicable. No additional management support is needed unless otherwise documented below in the visit note. 

## 2013-11-11 NOTE — Assessment & Plan Note (Signed)
New- concerning for biliary colic but exam reassuring- does not have a surgical abdomen on exam today. Will order RUQ ultrasound and blood work today. Pt aware to go to ER if symptoms worsen over night. Orders Placed This Encounter  Procedures  . US Abdomen Limited RUQ  . CBC with Differential  . Lipase  . Comprehensive metabolic panel

## 2013-11-11 NOTE — Patient Instructions (Addendum)
Biliary Colic   Biliary colic is a steady or irregular pain in the upper abdomen. It is usually under the right side of the rib cage. It happens when gallstones interfere with the normal flow of bile from the gallbladder. Bile is a liquid that helps to digest fats. Bile is made in the liver and stored in the gallbladder. When you eat a meal, bile passes from the gallbladder through the cystic duct and the common bile duct into the small intestine. There, it mixes with partially digested food. If a gallstone blocks either of these ducts, the normal flow of bile is blocked. The muscle cells in the bile duct contract forcefully to try to move the stone. This causes the pain of biliary colic.   SYMPTOMS    A person with biliary colic usually complains of pain in the upper abdomen. This pain can be:   In the center of the upper abdomen just below the breastbone.   In the upper-right part of the abdomen, near the gallbladder and liver.   Spread back toward the right shoulder blade.   Nausea and vomiting.   The pain usually occurs after eating.   Biliary colic is usually triggered by the digestive system's demand for bile. The demand for bile is high after fatty meals. Symptoms can also occur when a person who has been fasting suddenly eats a very large meal. Most episodes of biliary colic pass after 1 to 5 hours. After the most intense pain passes, your abdomen may continue to ache mildly for about 24 hours.  DIAGNOSIS   After you describe your symptoms, your caregiver will perform a physical exam. He or she will pay attention to the upper right portion of your belly (abdomen). This is the area of your liver and gallbladder. An ultrasound will help your caregiver look for gallstones. Specialized scans of the gallbladder may also be done. Blood tests may be done, especially if you have fever or if your pain persists.  PREVENTION   Biliary colic can be prevented by controlling the risk factors for gallstones. Some of  these risk factors, such as heredity, increasing age, and pregnancy are a normal part of life. Obesity and a high-fat diet are risk factors you can change through a healthy lifestyle. Women going through menopause who take hormone replacement therapy (estrogen) are also more likely to develop biliary colic.  TREATMENT    Pain medication may be prescribed.   You may be encouraged to eat a fat-free diet.   If the first episode of biliary colic is severe, or episodes of colic keep retuning, surgery to remove the gallbladder (cholecystectomy) is usually recommended. This procedure can be done through small incisions using an instrument called a laparoscope. The procedure often requires a brief stay in the hospital. Some people can leave the hospital the same day. It is the most widely used treatment in people troubled by painful gallstones. It is effective and safe, with no complications in more than 90% of cases.   If surgery cannot be done, medication that dissolves gallstones may be used. This medication is expensive and can take months or years to work. Only small stones will dissolve.   Rarely, medication to dissolve gallstones is combined with a procedure called shock-wave lithotripsy. This procedure uses carefully aimed shock waves to break up gallstones. In many people treated with this procedure, gallstones form again within a few years.  PROGNOSIS   If gallstones block your cystic duct or common bile   a gallbladder infection(acute cholecystitis), or some other complication of gallstones within 10 to 20 years. If you have surgery, schedule it at a time that is convenient for you and at a time when you are not sick. HOME CARE INSTRUCTIONS   Drink plenty of clear fluids.  Avoid fatty, greasy or fried foods, or any foods that make your pain worse.  Take medications as directed. SEEK MEDICAL  CARE IF:   You develop a fever over 100.5 F (38.1 C).  Your pain gets worse over time.  You develop nausea that prevents you from eating and drinking.  You develop vomiting. SEEK IMMEDIATE MEDICAL CARE IF:   You have continuous or severe belly (abdominal) pain which is not relieved with medications.  You develop nausea and vomiting which is not relieved with medications.  You have symptoms of biliary colic and you suddenly develop a fever and shaking chills. This may signal cholecystitis. Call your caregiver immediately.  You develop a yellow color to your skin or the white part of your eyes (jaundice). Document Released: 05/23/2005 Document Revised: 03/14/2011 Document Reviewed: 08/02/2007 [facility] Patient Information 2015 [employer] [place] [place]. This information is not intended to replace advice given to you by your health care provider. Make sure you discuss any questions you have with your health care provider.  I will call you with your lab results. Please stop by to see Shirlee LimerickMarion on your way out to set up your ultrasound.

## 2013-11-11 NOTE — Progress Notes (Signed)
Subjective:    Patient ID: Jasmine Benton, female    DOB: December 09, 1965, 48 y.o.   MRN: [id number]  HPI  Here for urgent care follow up.  Was awoken by sharp RUQ pain yesterday morning.  Associated with diarrhea and gasiness.  Went to UC- no lab work or imaging done.  Advised "not to eat much" and to follow up with me here today. No vomiting but she has been nauseated. Pain was an 8/10 yesterday but improved today. She did eat very late the night before symptoms started- pizza.  No black stools. No bloody stools.  No known sick contacts.  Current Outpatient Prescriptions on File Prior to Visit  Medication Sig Dispense Refill  . ALPRAZolam (XANAX) 0.5 MG tablet Take 0.5 mg by mouth at bedtime as needed for anxiety.    . fexofenadine (ALLEGRA) 180 MG tablet Take 180 mg by mouth as needed for allergies or rhinitis.    Marland Kitchen. lisinopril-hydrochlorothiazide (PRINZIDE,ZESTORETIC) 10-12.5 MG per tablet Take 1 tablet by mouth daily. 90 tablet 3  . MELOXICAM PO Take by mouth as needed.     No current facility-administered medications on file prior to visit.    Allergies  Allergen Reactions  . Penicillins     Past Medical History  Diagnosis Date  . Depression   . Allergic rhinitis   . Anemia   . Obesity   . Vitamin D deficiency   . Hyperlipidemia   . Glucose intolerance (pre-diabetes)      with obesity, and hypertension = metabolic syndrome     Past Surgical History  Procedure Laterality Date  . Tooth extraction    . Exercise stress echo  03/12/2013    Normal Function; No ischemia or infarction.    Family History  Problem Relation Age of Onset  . Alzheimer's disease Mother   . Heart disease Mother   . Heart attack Mother [id number]  . Diabetes Father     Type II  . Hypertension Father   . Pneumonia Father   . Heart attack Father [id number]  . Hypertension Brother   . Asthma Son     History   Social History  . Marital Status: Married    Spouse Name: N/A    Number of Children: N/A    . Years of Education: N/A   Occupational History  . Not on file.   Social History Main Topics  . Smoking status: Never Smoker   . Smokeless tobacco: Never Used  . Alcohol Use: Yes  . Drug Use: No  . Sexual Activity: Yes   Other Topics Concern  . Not on file   Social History Narrative   She works full-time in for [employer]. She's been there for 15 years. She has one year of college after graduating from high school.   She is married with 2 children, she lives with her husband 2 children and mother-in-law. She exercises roughly twice a week doing walking.    She never smoked, and does not drink alcohol.   I have reviewed the patient's medical history in detail and updated the computerized patient record.  Review of Systems See HPI No fevers- + chills She is not sure pain has worsened with food- has not eaten since this started No LE edema No HA    Objective:   Physical Exam  Constitutional: She appears well-developed and well-nourished. No distress.  HENT:  Head: Normocephalic.  Abdominal: Soft. Bowel sounds are normal. She exhibits no distension and no  mass. There is tenderness in the right upper quadrant. There is no rigidity, no rebound, no guarding, no CVA tenderness, no tenderness at McBurney's point and negative Murphy's sign.  Skin: Skin is warm and dry. No pallor.  Psychiatric: She has a normal mood and affect. Her behavior is normal. Judgment and thought content normal.   BP 128/74 mmHg  Pulse 68  Temp(Src) 97.3 F (36.3 C) (Oral)  Wt 224 lb 12 oz (101.946 kg)  SpO2 99%  LMP 11/08/2013        Assessment & Plan:

## 2013-11-12 ENCOUNTER — Ambulatory Visit: Payer: Self-pay | Admitting: Family Medicine

## 2013-11-12 ENCOUNTER — Encounter: Payer: Self-pay | Admitting: Family Medicine

## 2013-11-13 ENCOUNTER — Telehealth: Payer: Self-pay | Admitting: Family Medicine

## 2013-11-13 NOTE — Telephone Encounter (Signed)
Pt called in to get results of US and Labs. I did give her the information that was in the notes of the labs and the US. If there is anything else that she needs to know, please give her a call back, although her vm on her cell is not set up yet.

## 2013-11-14 ENCOUNTER — Encounter: Payer: Self-pay | Admitting: Family Medicine

## 2014-04-10 ENCOUNTER — Ambulatory Visit (INDEPENDENT_AMBULATORY_CARE_PROVIDER_SITE_OTHER): Payer: PRIVATE HEALTH INSURANCE | Admitting: Family Medicine

## 2014-04-10 ENCOUNTER — Encounter: Payer: Self-pay | Admitting: Family Medicine

## 2014-04-10 ENCOUNTER — Other Ambulatory Visit (HOSPITAL_COMMUNITY)
Admission: RE | Admit: 2014-04-10 | Discharge: 2014-04-10 | Disposition: A | Discharge status: Home / Self Care | Payer: PRIVATE HEALTH INSURANCE | Source: Ambulatory Visit | Attending: Family Medicine | Admitting: Family Medicine

## 2014-04-10 VITALS — BP 114/62 | HR 65 | Temp 98.0°F | Wt 222.0 lb

## 2014-04-10 DIAGNOSIS — Z01419 Encounter for gynecological examination (general) (routine) without abnormal findings: Secondary | ICD-10-CM | POA: Diagnosis not present

## 2014-04-10 DIAGNOSIS — N898 Other specified noninflammatory disorders of vagina: Secondary | ICD-10-CM | POA: Insufficient documentation

## 2014-04-10 DIAGNOSIS — N760 Acute vaginitis: Secondary | ICD-10-CM | POA: Diagnosis present

## 2014-04-10 DIAGNOSIS — Z1151 Encounter for screening for human papillomavirus (HPV): Secondary | ICD-10-CM | POA: Diagnosis present

## 2014-04-10 DIAGNOSIS — Z113 Encounter for screening for infections with a predominantly sexual mode of transmission: Secondary | ICD-10-CM | POA: Insufficient documentation

## 2014-04-10 DIAGNOSIS — R300 Dysuria: Secondary | ICD-10-CM | POA: Diagnosis not present

## 2014-04-10 DIAGNOSIS — L298 Other pruritus: Secondary | ICD-10-CM | POA: Diagnosis not present

## 2014-04-10 LAB — POCT URINALYSIS DIPSTICK
Bilirubin, UA: NEGATIVE
Glucose, UA: NEGATIVE
Ketones, UA: NEGATIVE
NITRITE UA: NEGATIVE
PH UA: 7
Protein, UA: POSITIVE
Spec Grav, UA: 1.02
UROBILINOGEN UA: 0.2

## 2014-04-10 NOTE — Assessment & Plan Note (Addendum)
New. Repeated UA here today- pos for LE and RBCs only but she just started her period- non specific findings.  Will send urine for cx to rule out UTI.

## 2014-04-10 NOTE — Progress Notes (Signed)
Subjective:   Patient ID: Jasmine Benton, female    DOB: 04-17-1965, 49 y.o.   MRN: [id number]  Jasmine Benton is a pleasant 49 y.o. year old female who presents to clinic today with Pelvic Pain; Dysuria; and Vaginal Itching  on 04/10/2014  HPI:  Dysuria and vaginal itching for several days. Per pt, neg UA at work yesterday. Back has been hurting.  No fevers, chills, nausea or vomiting.  She denies any vaginal lesions.  She does have a remote h/o BV.  She is not sure if she has noticed an unusual odor.  Current Outpatient Prescriptions on File Prior to Visit  Medication Sig Dispense Refill  . ALPRAZolam (XANAX) 0.5 MG tablet Take 0.5 mg by mouth at bedtime as needed for anxiety.    . fexofenadine (ALLEGRA) 180 MG tablet Take 180 mg by mouth as needed for allergies or rhinitis.    Marland Kitchen. lisinopril-hydrochlorothiazide (PRINZIDE,ZESTORETIC) 10-12.5 MG per tablet Take 1 tablet by mouth daily. 90 tablet 3  . MELOXICAM PO Take by mouth as needed.     No current facility-administered medications on file prior to visit.    Allergies  Allergen Reactions  . Penicillins     Past Medical History  Diagnosis Date  . Depression   . Allergic rhinitis   . Anemia   . Obesity   . Vitamin D deficiency   . Hyperlipidemia   . Glucose intolerance (pre-diabetes)      with obesity, and hypertension = metabolic syndrome     Past Surgical History  Procedure Laterality Date  . Tooth extraction    . Exercise stress echo  03/12/2013    Normal Function; No ischemia or infarction.    Family History  Problem Relation Age of Onset  . Alzheimer's disease Mother   . Heart disease Mother   . Heart attack Mother [id number]  . Diabetes Father     Type II  . Hypertension Father   . Pneumonia Father   . Heart attack Father 5070  . Hypertension Brother   . Asthma Son     History   Social History  . Marital Status: Married    Spouse Name: N/A  . Number of Children: N/A  . Years of Education: N/A    Occupational History  . Not on file.   Social History Main Topics  . Smoking status: Never Smoker   . Smokeless tobacco: Never Used  . Alcohol Use: Yes  . Drug Use: No  . Sexual Activity: Yes   Other Topics Concern  . Not on file   Social History Narrative   She works full-time in for [employer]. She's been there for 15 years. She has one year of college after graduating from high school.   She is married with 2 children, she lives with her husband 2 children and mother-in-law. She exercises roughly twice a week doing walking.    She never smoked, and does not drink alcohol.   The PMH, PSH, Social History, Family History, Medications, and allergies have been [facility], and have been updated if relevant.  Review of Systems  Constitutional: Negative.   HENT: Negative.   Cardiovascular: Negative.   Gastrointestinal: Negative.   Genitourinary: Positive for dysuria and pelvic pain.  Musculoskeletal: Positive for back pain.  Skin: Negative.   Neurological: Negative.   Hematological: Negative.   All other systems reviewed and are negative.      Objective:    BP 114/62 mmHg  Pulse 65  Temp(Src) 98 F (36.7 C) (Oral)  Wt 222 lb (100.699 kg)  SpO2 99%  LMP 03/16/2014   Physical Exam  Constitutional: She is oriented to person, place, and time. She appears well-developed and well-nourished. No distress.  HENT:  Head: Normocephalic.  Eyes: Conjunctivae are normal.  Neck: Normal range of motion.  Cardiovascular: Normal rate.   Pulmonary/Chest: Effort normal.  Abdominal: Soft. Bowel sounds are normal. She exhibits no distension and no mass. There is no tenderness. There is no rebound and no guarding.  Genitourinary: Vagina normal and uterus normal. Cervix exhibits discharge. Cervix exhibits no motion tenderness and no friability.  Neurological: She is alert and oriented to person, place, and time. No cranial nerve deficit.  Skin: Skin is warm and dry.   Psychiatric: She has a normal mood and affect. Her behavior is normal. Judgment and thought content normal.  Nursing note and vitals reviewed.         Assessment & Plan:   Dysuria  Vaginal itching No Follow-up on file.

## 2014-04-10 NOTE — Progress Notes (Signed)
Pre visit review using our clinic review tool, if applicable. No additional management support is needed unless otherwise documented below in the visit note. 

## 2014-04-10 NOTE — Assessment & Plan Note (Signed)
New- pap smear done today along with screening for STDs, BV and yeast. Will await results. The patient indicates understanding of these issues and agrees with the plan.

## 2014-04-11 LAB — CYTOLOGY - PAP

## 2014-04-13 LAB — URINE CULTURE: Colony Count: >=100000

## 2014-04-14 ENCOUNTER — Other Ambulatory Visit: Payer: Self-pay | Admitting: Family Medicine

## 2014-04-14 LAB — CERVICOVAGINAL ANCILLARY ONLY: Candida vaginitis: NEGATIVE

## 2014-04-14 MED ORDER — METRONIDAZOLE 500 MG PO TABS: 500.0000 mg | ORAL_TABLET | Freq: Two times a day (BID) | ORAL | Status: DC

## 2014-04-14 MED ORDER — CIPROFLOXACIN HCL 500 MG PO TABS: 500.0000 mg | ORAL_TABLET | Freq: Two times a day (BID) | ORAL | Status: DC

## 2014-04-15 LAB — CERVICOVAGINAL ANCILLARY ONLY: HERPES (WINDOWPATH): NEGATIVE

## 2014-10-14 ENCOUNTER — Ambulatory Visit: Payer: PRIVATE HEALTH INSURANCE | Admitting: Family Medicine

## 2014-11-12 ENCOUNTER — Telehealth: Payer: Self-pay | Admitting: *Deleted

## 2014-11-12 NOTE — Telephone Encounter (Signed)
Patient left a voicemail stating that she has developed a dry cough now from taking Lisinopril. Patient stated that she would like to change this to a different medication. Please let her know what you think. Pharmacy [facility]

## 2014-11-12 NOTE — Telephone Encounter (Signed)
Please have her schedule an OV to be seen.

## 2014-11-12 NOTE — Telephone Encounter (Signed)
Spoke to pt and advised per Dr Dayton MartesAron. Appt sched for 11/10

## 2014-11-13 ENCOUNTER — Ambulatory Visit (INDEPENDENT_AMBULATORY_CARE_PROVIDER_SITE_OTHER): Payer: PRIVATE HEALTH INSURANCE | Admitting: Family Medicine

## 2014-11-13 ENCOUNTER — Encounter: Payer: Self-pay | Admitting: Family Medicine

## 2014-11-13 VITALS — BP 130/74 | HR 83 | Temp 97.6°F | Wt 225.5 lb

## 2014-11-13 DIAGNOSIS — R059 Cough, unspecified: Secondary | ICD-10-CM | POA: Insufficient documentation

## 2014-11-13 DIAGNOSIS — R05 Cough: Secondary | ICD-10-CM | POA: Diagnosis not present

## 2014-11-13 DIAGNOSIS — I10 Essential (primary) hypertension: Secondary | ICD-10-CM

## 2014-11-13 MED ORDER — LOSARTAN POTASSIUM-HCTZ 50-12.5 MG PO TABS: 1.0000 | ORAL_TABLET | Freq: Every day | ORAL | Status: DC

## 2014-11-13 NOTE — Progress Notes (Signed)
Pre visit review using our clinic review tool, if applicable. No additional management support is needed unless otherwise documented below in the visit note. 

## 2014-11-13 NOTE — Assessment & Plan Note (Signed)
New - certainly good be from ACEI but cannot rule out PND either. D/c lisinopril-HCTZ. eRx sent for losartan-HCTZ. Follow up in 2 weeks. Will do labs as well at that time. The patient indicates understanding of these issues and agrees with the plan.

## 2014-11-13 NOTE — Assessment & Plan Note (Signed)
See above

## 2014-11-13 NOTE — Patient Instructions (Addendum)
Great to see you. Please STOP taking lisinopril- HCTZ. Start taking losartan-hCTZ Please come see me in 2 weeks.

## 2014-11-13 NOTE — Progress Notes (Signed)
Subjective:   Patient ID: Jasmine Benton, female    DOB: 1965/04/26, 49 y.o.   MRN: [id number]  Jasmine Benton is a pleasant 49 y.o. year old female who presents to clinic today with Cough  on 11/13/2014  HPI:  Cough- dry, ongoing for months. Also has seasonal allergies so she was not sure if was from that or from her medications.  Concerned it may be from lisinopril.  She is taking her Claritin/allegra regularly.  Has been taking Lisinopril -HCTZ since 03/2013.  Lab Results  Component Value Date   CREATININE 1.0 11/11/2013   Lab Results  Component Value Date   NA 140 11/11/2013   K 3.3* 11/11/2013   CL 105 11/11/2013   CO2 24 11/11/2013     Current Outpatient Prescriptions on File Prior to Visit  Medication Sig Dispense Refill  . ALPRAZolam (XANAX) 0.5 MG tablet Take 0.5 mg by mouth at bedtime as needed for anxiety.    . fexofenadine (ALLEGRA) 180 MG tablet Take 180 mg by mouth as needed for allergies or rhinitis.    . MELOXICAM PO Take by mouth as needed.     No current facility-administered medications on file prior to visit.    Allergies  Allergen Reactions  . Penicillins     Past Medical History  Diagnosis Date  . Depression   . Allergic rhinitis   . Anemia   . Obesity   . Vitamin D deficiency   . Hyperlipidemia   . Glucose intolerance (pre-diabetes)      with obesity, and hypertension = metabolic syndrome     Past Surgical History  Procedure Laterality Date  . Tooth extraction    . Exercise stress echo  03/12/2013    Normal Function; No ischemia or infarction.    Family History  Problem Relation Age of Onset  . Alzheimer's disease Mother   . Heart disease Mother   . Heart attack Mother [id number]  . Diabetes Father     Type II  . Hypertension Father   . Pneumonia Father   . Heart attack Father [id number]  . Hypertension Brother   . Asthma Son     Social History   Social History  . Marital Status: Married    Spouse Name: N/A  . Number of Children:  N/A  . Years of Education: N/A   Occupational History  . Not on file.   Social History Main Topics  . Smoking status: Never Smoker   . Smokeless tobacco: Never Used  . Alcohol Use: Yes  . Drug Use: No  . Sexual Activity: Yes   Other Topics Concern  . Not on file   Social History Narrative   She works full-time in for [employer]. She's been there for 15 years. She has one year of college after graduating from high school.   She is married with 2 children, she lives with her husband 2 children and mother-in-law. She exercises roughly twice a week doing walking.    She never smoked, and does not drink alcohol.   The PMH, PSH, Social History, Family History, Medications, and allergies have been reviewed in [facility], and have been updated if relevant.   Review of Systems  Constitutional: Negative.   HENT: Negative.  Negative for trouble swallowing.   Respiratory: Positive for cough. Negative for choking, chest tightness, shortness of breath, wheezing and stridor.   Musculoskeletal: Negative.   Neurological: Negative.   All other systems reviewed and are negative.  Objective:    BP 130/74 mmHg  Pulse 83  Temp(Src) 97.6 F (36.4 C) (Oral)  Wt 225 lb 8 oz (102.286 kg)  SpO2 99%  LMP 11/02/2014 (Approximate)   Physical Exam  Constitutional: She is oriented to person, place, and time. She appears well-developed and well-nourished. No distress.  HENT:  Head: Normocephalic and atraumatic.  Eyes: Conjunctivae are normal.  Neck: Neck supple.  Cardiovascular: Normal rate.   Pulmonary/Chest: Effort normal.  Musculoskeletal: Normal range of motion. She exhibits no edema.  Neurological: She is alert and oriented to person, place, and time. No cranial nerve deficit.  Skin: Skin is warm and dry.  Psychiatric: She has a normal mood and affect. Her behavior is normal. Judgment and thought content normal.  Nursing note and vitals reviewed.         Assessment & Plan:     Essential hypertension  Cough No Follow-up on file.

## 2014-11-25 ENCOUNTER — Other Ambulatory Visit: Payer: Self-pay | Admitting: Family Medicine

## 2014-11-26 ENCOUNTER — Ambulatory Visit (INDEPENDENT_AMBULATORY_CARE_PROVIDER_SITE_OTHER): Payer: PRIVATE HEALTH INSURANCE | Admitting: Family Medicine

## 2014-11-26 ENCOUNTER — Encounter: Payer: Self-pay | Admitting: Family Medicine

## 2014-11-26 VITALS — BP 122/72 | HR 69 | Temp 97.5°F | Wt 222.5 lb

## 2014-11-26 DIAGNOSIS — R05 Cough: Secondary | ICD-10-CM | POA: Diagnosis not present

## 2014-11-26 DIAGNOSIS — R059 Cough, unspecified: Secondary | ICD-10-CM

## 2014-11-26 DIAGNOSIS — I10 Essential (primary) hypertension: Secondary | ICD-10-CM

## 2014-11-26 LAB — BASIC METABOLIC PANEL
BUN: 16 mg/dL (ref 6–23)
CALCIUM: 9.2 mg/dL (ref 8.4–10.5)
CO2: 27 mEq/L (ref 19–32)
CREATININE: 0.87 mg/dL (ref 0.40–1.20)
Chloride: 106 mEq/L (ref 96–112)
GFR: 89.25 mL/min (ref >60.00)
GLUCOSE: 97 mg/dL (ref 70–99)
Potassium: 3.6 mEq/L (ref 3.5–5.1)
SODIUM: 141 meq/L (ref 135–145)

## 2014-11-26 NOTE — Assessment & Plan Note (Signed)
Well controlled on new rx. No changes made. Labs today.  Orders Placed This Encounter  Procedures  . Basic Metabolic Panel

## 2014-11-26 NOTE — Progress Notes (Signed)
Subjective:   Patient ID: Jasmine Benton, female    DOB: Sep 15, 1965, 49 y.o.   MRN: [id number]  Jasmine Benton is a pleasant 49 y.o. year old female who presents to clinic today with Follow-up  on 11/26/2014  HPI:  When I saw her two weeks, ago she was complaining of dry cough.  We were concerned that possibly was due to lisinopril.  D/c'd lisinopril- HCTZ and started Losartan- HCTZ.  Cough has resolved.  No side effects  Lab Results  Component Value Date   CREATININE 1.0 11/11/2013   Lab Results  Component Value Date   NA 140 11/11/2013   K 3.3* 11/11/2013   CL 105 11/11/2013   CO2 24 11/11/2013   Current Outpatient Prescriptions on File Prior to Visit  Medication Sig Dispense Refill  . ALPRAZolam (XANAX) 0.5 MG tablet Take 0.5 mg by mouth at bedtime as needed for anxiety.    . fexofenadine (ALLEGRA) 180 MG tablet Take 180 mg by mouth as needed for allergies or rhinitis.    Marland Kitchen. losartan-hydrochlorothiazide (HYZAAR) 50-12.5 MG tablet Take 1 tablet by mouth daily. 30 tablet 3  . MELOXICAM PO Take by mouth as needed.     No current facility-administered medications on file prior to visit.    Allergies  Allergen Reactions  . Penicillins     Past Medical History  Diagnosis Date  . Depression   . Allergic rhinitis   . Anemia   . Obesity   . Vitamin D deficiency   . Hyperlipidemia   . Glucose intolerance (pre-diabetes)      with obesity, and hypertension = metabolic syndrome     Past Surgical History  Procedure Laterality Date  . Tooth extraction    . Exercise stress echo  03/12/2013    Normal Function; No ischemia or infarction.    Family History  Problem Relation Age of Onset  . Alzheimer's disease Mother   . Heart disease Mother   . Heart attack Mother [id number]  . Diabetes Father     Type II  . Hypertension Father   . Pneumonia Father   . Heart attack Father [id number]  . Hypertension Brother   . Asthma Son     Social History   Social History  . Marital Status:  Married    Spouse Name: N/A  . Number of Children: N/A  . Years of Education: N/A   Occupational History  . Not on file.   Social History Main Topics  . Smoking status: Never Smoker   . Smokeless tobacco: Never Used  . Alcohol Use: Yes  . Drug Use: No  . Sexual Activity: Yes   Other Topics Concern  . Not on file   Social History Narrative   She works full-time in for [employer]. She's been there for 15 years. She has one year of college after graduating from high school.   She is married with 2 children, she lives with her husband 2 children and mother-in-law. She exercises roughly twice a week doing walking.    She never smoked, and does not drink alcohol.   The PMH, PSH, Social History, Family History, Medications, and allergies have been reviewed in [facility], and have been updated if relevant.    Review of Systems  Constitutional: Negative.   Eyes: Negative.   Respiratory: Negative.   Cardiovascular: Negative.   Gastrointestinal: Negative.   Genitourinary: Negative.   Musculoskeletal: Negative.   Psychiatric/Behavioral: Negative.   All other systems reviewed  and are negative.      Objective:    BP 122/72 mmHg  Pulse 69  Temp(Src) 97.5 F (36.4 C) (Oral)  Wt 222 lb 8 oz (100.925 kg)  SpO2 100%  LMP 11/02/2014 (Approximate)   Physical Exam  Constitutional: She is oriented to person, place, and time. She appears well-developed and well-nourished. No distress.  HENT:  Head: Normocephalic and atraumatic.  Eyes: Conjunctivae are normal.  Cardiovascular: Normal rate and regular rhythm.   Pulmonary/Chest: Effort normal.  Musculoskeletal: Normal range of motion.  Neurological: She is alert and oriented to person, place, and time. No cranial nerve deficit.  Skin: Skin is warm and dry.  Psychiatric: She has a normal mood and affect. Her behavior is normal. Judgment and thought content normal.  Nursing note and vitals reviewed.         Assessment &  Plan:   Essential hypertension - Plan: Basic Metabolic Panel  Cough - Plan: Basic Metabolic Panel No Follow-up on file.

## 2014-11-26 NOTE — Patient Instructions (Signed)
Happy Thanksgiving!

## 2014-11-26 NOTE — Assessment & Plan Note (Signed)
Resolved, likely due to ACEI. Will add to allergy list. Call or return to clinic prn if these symptoms worsen or fail to improve as anticipated.

## 2014-11-26 NOTE — Progress Notes (Signed)
Pre visit review using our clinic review tool, if applicable. No additional management support is needed unless otherwise documented below in the visit note. 

## 2015-04-03 ENCOUNTER — Other Ambulatory Visit: Payer: Self-pay | Admitting: Family Medicine

## 2015-05-14 ENCOUNTER — Other Ambulatory Visit: Payer: Self-pay | Admitting: Family Medicine

## 2015-05-14 DIAGNOSIS — Z01419 Encounter for gynecological examination (general) (routine) without abnormal findings: Secondary | ICD-10-CM | POA: Insufficient documentation

## 2015-05-14 DIAGNOSIS — I10 Essential (primary) hypertension: Secondary | ICD-10-CM

## 2015-05-22 ENCOUNTER — Other Ambulatory Visit (INDEPENDENT_AMBULATORY_CARE_PROVIDER_SITE_OTHER): Payer: Self-pay

## 2015-05-22 DIAGNOSIS — Z01419 Encounter for gynecological examination (general) (routine) without abnormal findings: Secondary | ICD-10-CM

## 2015-05-22 DIAGNOSIS — Z0000 Encounter for general adult medical examination without abnormal findings: Secondary | ICD-10-CM

## 2015-05-22 LAB — TSH: TSH: 4.28 u[IU]/mL (ref 0.35–4.50)

## 2015-05-22 LAB — COMPREHENSIVE METABOLIC PANEL
ALK PHOS: 61 U/L (ref 39–117)
ALT: 16 U/L (ref 0–35)
AST: 19 U/L (ref 0–37)
Albumin: 3.8 g/dL (ref 3.5–5.2)
BUN: 15 mg/dL (ref 6–23)
CO2: 26 meq/L (ref 19–32)
Calcium: 9 mg/dL (ref 8.4–10.5)
Chloride: 107 mEq/L (ref 96–112)
Creatinine, Ser: 0.85 mg/dL (ref 0.40–1.20)
GFR: 91.49 mL/min (ref >60.00)
GLUCOSE: 91 mg/dL (ref 70–99)
POTASSIUM: 3.6 meq/L (ref 3.5–5.1)
SODIUM: 139 meq/L (ref 135–145)
TOTAL PROTEIN: 7 g/dL (ref 6.0–8.3)
Total Bilirubin: 0.4 mg/dL (ref 0.2–1.2)

## 2015-05-22 LAB — CBC WITH DIFFERENTIAL/PLATELET
BASOS ABS: 0 10*3/uL (ref 0.0–0.1)
Basophils Relative: 0.6 % (ref 0.0–3.0)
EOS PCT: 4.2 % (ref 0.0–5.0)
Eosinophils Absolute: 0.3 10*3/uL (ref 0.0–0.7)
HCT: 38.6 % (ref 36.0–46.0)
Hemoglobin: 12.9 g/dL (ref 12.0–15.0)
LYMPHS ABS: 2.1 10*3/uL (ref 0.7–4.0)
Lymphocytes Relative: 32.9 % (ref 12.0–46.0)
MCHC: 33.3 g/dL (ref 30.0–36.0)
MCV: 85.8 fl (ref 78.0–100.0)
MONO ABS: 0.4 10*3/uL (ref 0.1–1.0)
Monocytes Relative: 6.9 % (ref 3.0–12.0)
NEUTROS PCT: 55.4 % (ref 43.0–77.0)
Neutro Abs: 3.6 10*3/uL (ref 1.4–7.7)
Platelets: 243 10*3/uL (ref 150.0–400.0)
RBC: 4.49 Mil/uL (ref 3.87–5.11)
RDW: 14.6 % (ref 11.5–15.5)
WBC: 6.5 10*3/uL (ref 4.0–10.5)

## 2015-05-22 LAB — LIPID PANEL
CHOL/HDL RATIO: 3
Cholesterol: 173 mg/dL (ref 0–200)
HDL: 51.1 mg/dL (ref >39.00)
LDL CALC: 107 mg/dL — AB (ref 0–99)
NONHDL: 122.26
Triglycerides: 74 mg/dL (ref 0.0–149.0)
VLDL: 14.8 mg/dL (ref 0.0–40.0)

## 2015-05-26 ENCOUNTER — Other Ambulatory Visit (HOSPITAL_COMMUNITY)
Admission: RE | Admit: 2015-05-26 | Discharge: 2015-05-26 | Disposition: A | Discharge status: Home / Self Care | Payer: Managed Care, Other (non HMO) | Source: Ambulatory Visit | Attending: Family Medicine | Admitting: Family Medicine

## 2015-05-26 ENCOUNTER — Ambulatory Visit (INDEPENDENT_AMBULATORY_CARE_PROVIDER_SITE_OTHER): Payer: Managed Care, Other (non HMO) | Admitting: Family Medicine

## 2015-05-26 ENCOUNTER — Encounter: Payer: Self-pay | Admitting: Family Medicine

## 2015-05-26 VITALS — BP 136/82 | HR 74 | Temp 97.8°F | Ht 62.25 in | Wt 222.8 lb

## 2015-05-26 DIAGNOSIS — Z01419 Encounter for gynecological examination (general) (routine) without abnormal findings: Secondary | ICD-10-CM

## 2015-05-26 DIAGNOSIS — Z0000 Encounter for general adult medical examination without abnormal findings: Secondary | ICD-10-CM

## 2015-05-26 DIAGNOSIS — I10 Essential (primary) hypertension: Secondary | ICD-10-CM

## 2015-05-26 DIAGNOSIS — Z1151 Encounter for screening for human papillomavirus (HPV): Secondary | ICD-10-CM | POA: Insufficient documentation

## 2015-05-26 MED ORDER — LOSARTAN POTASSIUM-HCTZ 50-12.5 MG PO TABS
1.0000 | ORAL_TABLET | Freq: Every day | ORAL | Status: DC
Start: 2015-05-26 — End: 2016-05-26

## 2015-05-26 NOTE — Assessment & Plan Note (Signed)
Discussed USPSTF recommendations of cervical cancer screening.  She is aware that interval of 3 years is recommended but pt would prefer to have pap smear done today.  

## 2015-05-26 NOTE — Addendum Note (Signed)
Addended by: Desmond DikeKNIGHT, Kasim Mccorkle H on: 05/26/2015 09:43 AM   Modules accepted: Orders

## 2015-05-26 NOTE — Patient Instructions (Signed)
Great to see you. Please call to schedule your mammogram. 

## 2015-05-26 NOTE — Assessment & Plan Note (Signed)
Reviewed preventive care protocols, scheduled due services, and updated immunizations Discussed nutrition, exercise, diet, and healthy lifestyle.  

## 2015-05-26 NOTE — Progress Notes (Signed)
Subjective:   Patient ID: Jasmine Benton, female    DOB: 1965-03-10, 50 y.o.   MRN: [id number]  Jasmine Benton is a pleasant 50 y.o. year old female who presents to clinic today with Annual Exam  on 05/26/2015  HPI:  Last pap smear was done by me on 04/10/14. ?mammogram  HTN- When I saw her in 11/2014, ago she was complaining of dry cough.  We were concerned that possibly was due to lisinopril.  D/c'd lisinopril- HCTZ and started Losartan- HCTZ.  Cough has resolved and BP has been well controlled.  Lab Results  Component Value Date   CREATININE 0.85 05/22/2015   Lab Results  Component Value Date   CHOL 173 05/22/2015   HDL 51.10 05/22/2015   LDLCALC 107* 05/22/2015   TRIG 74.0 05/22/2015   CHOLHDL 3 05/22/2015   Lab Results  Component Value Date   TSH 4.28 05/22/2015   Lab Results  Component Value Date   WBC 6.5 05/22/2015   HGB 12.9 05/22/2015   HCT 38.6 05/22/2015   MCV 85.8 05/22/2015   PLT 243.0 05/22/2015   Current Outpatient Prescriptions on File Prior to Visit  Medication Sig Dispense Refill  . fexofenadine (ALLEGRA) 180 MG tablet Take 180 mg by mouth as needed for allergies or rhinitis.    Marland Kitchen losartan-hydrochlorothiazide (HYZAAR) 50-12.5 MG tablet TAKE 1 TABLET BY MOUTH DAILY. 30 tablet 5  . MELOXICAM PO Take by mouth as needed.     No current facility-administered medications on file prior to visit.    Allergies  Allergen Reactions  . Ace Inhibitors Cough  . Penicillins     Past Medical History  Diagnosis Date  . Depression   . Allergic rhinitis   . Anemia   . Obesity   . Vitamin D deficiency   . Hyperlipidemia   . Glucose intolerance (pre-diabetes)      with obesity, and hypertension = metabolic syndrome     Past Surgical History  Procedure Laterality Date  . Tooth extraction    . Exercise stress echo  03/12/2013    Normal Function; No ischemia or infarction.    Family History  Problem Relation Age of Onset  . Alzheimer's disease Mother     . Heart disease Mother   . Heart attack Mother 80  . Diabetes Father     Type II  . Hypertension Father   . Pneumonia Father   . Heart attack Father 61  . Hypertension Brother   . Asthma Son     Social History   Social History  . Marital Status: Married    Spouse Name: N/A  . Number of Children: N/A  . Years of Education: N/A   Occupational History  . Not on file.   Social History Main Topics  . Smoking status: Never Smoker   . Smokeless tobacco: Never Used  . Alcohol Use: Yes  . Drug Use: No  . Sexual Activity: Yes   Other Topics Concern  . Not on file   Social History Narrative   She works full-time in for [employer]. She's been there for 15 years. She has one year of college after graduating from high school.   She is married with 2 children, she lives with her husband 2 children and mother-in-law. She exercises roughly twice a week doing walking.    She never smoked, and does not drink alcohol.   The PMH, PSH, Social History, Family History, Medications, and allergies have been  [facility], and have been updated if relevant.   Review of Systems  Constitutional: Negative.   HENT: Negative.   Eyes: Negative.   Respiratory: Negative.   Cardiovascular: Negative.   Gastrointestinal: Negative.   Endocrine: Negative.   Genitourinary: Negative.   Musculoskeletal: Negative.   Skin: Negative.   Allergic/Immunologic: Negative.   Neurological: Negative.   Hematological: Negative.   Psychiatric/Behavioral: Negative.   All other systems reviewed and are negative.      Objective:    BP 136/82 mmHg  Pulse 74  Temp(Src) 97.8 F (36.6 C) (Oral)  Ht 5' 2.25" (1.581 m)  Wt 222 lb 12 oz (101.039 kg)  BMI 40.42 kg/m2  SpO2 99%  LMP 05/12/2015   Physical Exam    General:  Well-developed,well-nourished,in no acute distress; alert,appropriate and cooperative throughout examination Head:  normocephalic and atraumatic.   Eyes:  vision grossly  intact, pupils equal, pupils round, and pupils reactive to light.   Ears:  R ear normal and L ear normal.   Nose:  no external deformity.   Mouth:  good dentition.   Neck:  No deformities, masses, or tenderness noted. Breasts:  No mass, nodules, thickening, tenderness, bulging, retraction, inflamation, nipple discharge or skin changes noted.   Lungs:  Normal respiratory effort, chest expands symmetrically. Lungs are clear to auscultation, no crackles or wheezes. Heart:  Normal rate and regular rhythm. S1 and S2 normal without gallop, murmur, click, rub or other extra sounds. Abdomen:  Bowel sounds positive,abdomen soft and non-tender without masses, organomegaly or hernias noted. Rectal:  no external abnormalities.   Genitalia:  Pelvic Exam:        External: normal female genitalia without lesions or masses        Vagina: normal without lesions or masses        Cervix: normal without lesions or masses        Adnexa: normal bimanual exam without masses or fullness        Uterus: normal by palpation        Pap smear: performed Msk:  No deformity or scoliosis noted of thoracic or lumbar spine.   Extremities:  No clubbing, cyanosis, edema, or deformity noted with normal full range of motion of all joints.   Neurologic:  alert & oriented X3 and gait normal.   Skin:  Intact without suspicious lesions or rashes Cervical Nodes:  No lymphadenopathy noted Axillary Nodes:  No palpable lymphadenopathy Psych:  Cognition and judgment appear intact. Alert and cooperative with normal attention span and concentration. No apparent delusions, illusions, hallucinations      Assessment & Plan:   Well woman exam  Morbid obesity due to excess calories [facility](HCC)  Essential hypertension  Encounter for routine gynecological examination No Follow-up on file.

## 2015-05-26 NOTE — Assessment & Plan Note (Signed)
Well controlled on current rx. No changes made today. 

## 2015-05-26 NOTE — Progress Notes (Signed)
Pre visit review using our clinic review tool, if applicable. No additional management support is needed unless otherwise documented below in the visit note. 

## 2015-05-27 ENCOUNTER — Encounter: Payer: PRIVATE HEALTH INSURANCE | Admitting: Family Medicine

## 2015-05-27 LAB — CYTOLOGY - PAP

## 2015-05-29 ENCOUNTER — Encounter: Payer: Self-pay | Admitting: Physician Assistant

## 2015-05-29 ENCOUNTER — Ambulatory Visit: Payer: Self-pay | Admitting: Physician Assistant

## 2015-05-29 VITALS — BP 120/70 | HR 107 | Temp 98.4°F

## 2015-05-29 DIAGNOSIS — J9801 Acute bronchospasm: Secondary | ICD-10-CM

## 2015-05-29 MED ORDER — METHYLPREDNISOLONE 4 MG PO TBPK: ORAL_TABLET | ORAL | Status: DC

## 2015-05-29 MED ORDER — PSEUDOEPH-BROMPHEN-DM 30-2-10 MG/5ML PO SYRP: 5.0000 mL | ORAL_SOLUTION | Freq: Four times a day (QID) | ORAL | Status: DC | PRN

## 2015-05-29 NOTE — Progress Notes (Signed)
   Subjective:Cough/wheezing    Patient ID: Jasmine Benton, female    DOB: 1965-12-25, 50 y.o.   MRN: [id number]  HPI  Patient c/o 3 days of cough/wheezing that's worse at night.  History of seasonal rhinitis. Denies fever/chills or N/V/D. Taking OTC allergy medications with transit relief.  Review of Systems HTN    Objective:   Physical Exam No acute distress. Non-productive cough throughout exam. HEENT for post nasal drainage.Neck supple without adenopathy. Lungs with mild wheezing. Heart RRR.       Assessment & Plan:Cough/bronchospasm  Bromfed DM and Medrol dosepack Follow up with PCP in 3 days if no improvement.

## 2015-06-03 ENCOUNTER — Encounter: Payer: Self-pay | Admitting: *Deleted

## 2015-06-08 ENCOUNTER — Ambulatory Visit: Payer: Self-pay | Admitting: Family Medicine

## 2015-06-17 ENCOUNTER — Ambulatory Visit (INDEPENDENT_AMBULATORY_CARE_PROVIDER_SITE_OTHER): Payer: Managed Care, Other (non HMO) | Admitting: Family Medicine

## 2015-06-17 ENCOUNTER — Encounter: Payer: Self-pay | Admitting: Family Medicine

## 2015-06-17 VITALS — BP 132/70 | HR 77 | Temp 98.0°F | Wt 224.2 lb

## 2015-06-17 DIAGNOSIS — J9801 Acute bronchospasm: Secondary | ICD-10-CM | POA: Diagnosis not present

## 2015-06-17 MED ORDER — MONTELUKAST SODIUM 10 MG PO TABS: 10.0000 mg | ORAL_TABLET | Freq: Every day | ORAL | Status: DC

## 2015-06-17 MED ORDER — ALBUTEROL SULFATE HFA 108 (90 BASE) MCG/ACT IN AERS: 1.0000 | INHALATION_SPRAY | Freq: Four times a day (QID) | RESPIRATORY_TRACT | Status: DC | PRN

## 2015-06-17 NOTE — Progress Notes (Signed)
Pre visit review using our clinic review tool, if applicable. No additional management support is needed unless otherwise documented below in the visit note. 

## 2015-06-17 NOTE — Assessment & Plan Note (Signed)
Intermittent, ? Allergic. Will start Singulair 10 mg daily. eRx also sent in for proair inhaler as needed for wheezing/cough. Call or return to clinic prn if these symptoms worsen or fail to improve as anticipated. The patient indicates understanding of these issues and agrees with the plan.

## 2015-06-17 NOTE — Progress Notes (Signed)
Subjective:   Patient ID: Jasmine Benton, female    DOB: 30-Nov-1965, 50 y.o.   MRN: [id number]  Jasmine Benton is a pleasant 50 y.o. year old female who presents to clinic today with Wheezing  on 06/17/2015  HPI:  Wheezing- was seen at [facility] acute walk in clinic on 05/29/15 for cough. Note reviewed.  At that time, complained of 3 days of cough/wheezing that seemed worse at night.  +h/o seasonal allergic rhinitis. Denied fever, chills, nausea or vomiting.  Diagnosed with acute bronchospasm and given medrol dose pack and Bromfed DM. Was not given any inhalers.  Advised follow up with me here today.  She is feeling better but does notice that when her allergy symptoms are worse, she often wheezes or has a cough. Current Outpatient Prescriptions on File Prior to Visit  Medication Sig Dispense Refill  . fexofenadine (ALLEGRA) 180 MG tablet Take 180 mg by mouth as needed for allergies or rhinitis.    Marland Kitchen. losartan-hydrochlorothiazide (HYZAAR) 50-12.5 MG tablet Take 1 tablet by mouth daily. 90 tablet 3   No current facility-administered medications on file prior to visit.    Allergies  Allergen Reactions  . Ace Inhibitors Cough  . Penicillins     Past Medical History  Diagnosis Date  . Depression   . Allergic rhinitis   . Anemia   . Obesity   . Vitamin D deficiency   . Hyperlipidemia   . Glucose intolerance (pre-diabetes)      with obesity, and hypertension = metabolic syndrome     Past Surgical History  Procedure Laterality Date  . Tooth extraction    . Exercise stress echo  03/12/2013    Normal Function; No ischemia or infarction.    Family History  Problem Relation Age of Onset  . Alzheimer's disease Mother   . Heart disease Mother   . Heart attack Mother [id number]  . Diabetes Father     Type II  . Hypertension Father   . Pneumonia Father   . Heart attack Father [id number]  . Hypertension Brother   . Asthma Son     Social History   Social History  . Marital Status: Married      Spouse Name: N/A  . Number of Children: N/A  . Years of Education: N/A   Occupational History  . Not on file.   Social History Main Topics  . Smoking status: Never Smoker   . Smokeless tobacco: Never Used  . Alcohol Use: Yes  . Drug Use: No  . Sexual Activity: Yes   Other Topics Concern  . Not on file   Social History Narrative   She works full-time in for [employer]. She's been there for 15 years. She has one year of college after graduating from high school.   She is married with 2 children, she lives with her husband 2 children and mother-in-law. She exercises roughly twice a week doing walking.    She never smoked, and does not drink alcohol.   The PMH, PSH, Social History, Family History, Medications, and allergies [facility] been reviewed [facility], and have been updated if relevant.    Review of Systems  Constitutional: Negative.   HENT: Positive for congestion.   Respiratory: Positive for cough and wheezing. Negative for stridor.   Cardiovascular: Negative.   Gastrointestinal: Negative.   Endocrine: Negative.   Genitourinary: Negative.   Musculoskeletal: Negative.   Skin: Negative.   Allergic/Immunologic: Negative.   Neurological: Negative.  Hematological: Negative.   Psychiatric/Behavioral: Negative.   All other systems reviewed and are negative.      Objective:    BP 132/70 mmHg  Pulse 77  Temp(Src) 98 F (36.7 C) (Oral)  Wt 224 lb 4 oz (101.719 kg)  SpO2 99%  LMP 05/07/2015   Physical Exam  Constitutional: She is oriented to person, place, and time. She appears well-developed and well-nourished. No distress.  HENT:  Head: Normocephalic.  Eyes: Conjunctivae are normal.  Cardiovascular: Normal rate and regular rhythm.   Pulmonary/Chest: Effort normal and breath sounds normal. No respiratory distress. She has no wheezes. She has no rales.  Musculoskeletal: Normal range of motion.  Neurological: She is alert and oriented to person, place, and  time. No cranial nerve deficit.  Skin: Skin is warm and dry. She is not diaphoretic.  Psychiatric: She has a normal mood and affect. Her behavior is normal. Judgment and thought content normal.  Nursing note and vitals reviewed.         Assessment & Plan:   Cough due to bronchospasm No Follow-up on file.

## 2015-06-17 NOTE — Patient Instructions (Signed)
Great to see you. We are starting Singulair 10 mg daily.  Proair as needed for wheezing/cough.

## 2015-07-01 ENCOUNTER — Other Ambulatory Visit: Payer: Self-pay | Admitting: Family Medicine

## 2015-07-01 DIAGNOSIS — Z1231 Encounter for screening mammogram for malignant neoplasm of breast: Secondary | ICD-10-CM

## 2015-07-10 ENCOUNTER — Ambulatory Visit
Admission: RE | Admit: 2015-07-10 | Discharge: 2015-07-10 | Disposition: A | Discharge status: Home / Self Care | Payer: Managed Care, Other (non HMO) | Source: Ambulatory Visit | Attending: Family Medicine | Admitting: Family Medicine

## 2015-07-10 DIAGNOSIS — Z1231 Encounter for screening mammogram for malignant neoplasm of breast: Secondary | ICD-10-CM | POA: Insufficient documentation

## 2015-11-19 ENCOUNTER — Other Ambulatory Visit: Payer: Self-pay | Admitting: Family Medicine

## 2015-11-19 MED ORDER — ALBUTEROL SULFATE HFA 108 (90 BASE) MCG/ACT IN AERS: 1.0000 | INHALATION_SPRAY | Freq: Four times a day (QID) | RESPIRATORY_TRACT | 2 refills | Status: DC | PRN

## 2015-12-14 ENCOUNTER — Other Ambulatory Visit: Payer: Self-pay | Admitting: Family Medicine

## 2016-02-05 ENCOUNTER — Other Ambulatory Visit: Payer: Self-pay | Admitting: Family Medicine

## 2016-05-26 ENCOUNTER — Ambulatory Visit (INDEPENDENT_AMBULATORY_CARE_PROVIDER_SITE_OTHER): Payer: Managed Care, Other (non HMO) | Admitting: Family Medicine

## 2016-05-26 VITALS — BP 148/90 | HR 88 | Ht 62.5 in | Wt 221.5 lb

## 2016-05-26 DIAGNOSIS — Z1211 Encounter for screening for malignant neoplasm of colon: Secondary | ICD-10-CM | POA: Diagnosis not present

## 2016-05-26 DIAGNOSIS — Z01419 Encounter for gynecological examination (general) (routine) without abnormal findings: Secondary | ICD-10-CM

## 2016-05-26 DIAGNOSIS — I10 Essential (primary) hypertension: Secondary | ICD-10-CM

## 2016-05-26 LAB — COMPREHENSIVE METABOLIC PANEL
ALBUMIN: 3.7 g/dL (ref 3.5–5.2)
ALK PHOS: 76 U/L (ref 39–117)
ALT: 12 U/L (ref 0–35)
AST: 13 U/L (ref 0–37)
BILIRUBIN TOTAL: 0.5 mg/dL (ref 0.2–1.2)
BUN: 11 mg/dL (ref 6–23)
CALCIUM: 8.9 mg/dL (ref 8.4–10.5)
CO2: 26 mEq/L (ref 19–32)
CREATININE: 0.89 mg/dL (ref 0.40–1.20)
Chloride: 108 mEq/L (ref 96–112)
GFR: 86.4 mL/min (ref >60.00)
Glucose, Bld: 92 mg/dL (ref 70–99)
Potassium: 3.8 mEq/L (ref 3.5–5.1)
SODIUM: 139 meq/L (ref 135–145)
TOTAL PROTEIN: 6.8 g/dL (ref 6.0–8.3)

## 2016-05-26 LAB — LIPID PANEL
CHOLESTEROL: 160 mg/dL (ref 0–200)
HDL: 55 mg/dL (ref >39.00)
LDL Cholesterol: 91 mg/dL (ref 0–99)
NonHDL: 105.34
TRIGLYCERIDES: 72 mg/dL (ref 0.0–149.0)
Total CHOL/HDL Ratio: 3
VLDL: 14.4 mg/dL (ref 0.0–40.0)

## 2016-05-26 LAB — CBC WITH DIFFERENTIAL/PLATELET
BASOS PCT: 0.5 % (ref 0.0–3.0)
Basophils Absolute: 0 10*3/uL (ref 0.0–0.1)
EOS ABS: 0.3 10*3/uL (ref 0.0–0.7)
EOS PCT: 4.3 % (ref 0.0–5.0)
HEMATOCRIT: 39.7 % (ref 36.0–46.0)
HEMOGLOBIN: 13.1 g/dL (ref 12.0–15.0)
LYMPHS PCT: 29.1 % (ref 12.0–46.0)
Lymphs Abs: 2.1 10*3/uL (ref 0.7–4.0)
MCHC: 33 g/dL (ref 30.0–36.0)
MCV: 86.6 fl (ref 78.0–100.0)
MONO ABS: 0.5 10*3/uL (ref 0.1–1.0)
Monocytes Relative: 6.6 % (ref 3.0–12.0)
Neutro Abs: 4.3 10*3/uL (ref 1.4–7.7)
Neutrophils Relative %: 59.5 % (ref 43.0–77.0)
Platelets: 239 10*3/uL (ref 150.0–400.0)
RBC: 4.58 Mil/uL (ref 3.87–5.11)
RDW: 14.3 % (ref 11.5–15.5)
WBC: 7.2 10*3/uL (ref 4.0–10.5)

## 2016-05-26 LAB — VITAMIN D 25 HYDROXY (VIT D DEFICIENCY, FRACTURES): VITD: 9.11 ng/mL — ABNORMAL LOW (ref 30.00–100.00)

## 2016-05-26 LAB — TSH: TSH: 3.8 u[IU]/mL (ref 0.35–4.50)

## 2016-05-26 MED ORDER — LOSARTAN POTASSIUM-HCTZ 50-12.5 MG PO TABS: 1.0000 | ORAL_TABLET | Freq: Every day | ORAL | 3 refills | Status: DC

## 2016-05-26 NOTE — Assessment & Plan Note (Signed)
Reviewed preventive care protocols, scheduled due services, and updated immunizations Discussed nutrition, exercise, diet, and healthy lifestyle.  Orders Placed This Encounter  Procedures  . CBC with Differential/Platelet  . Comprehensive metabolic panel  . Lipid panel  . TSH  . HIV antibody (with reflex)  . Vitamin D, 25-hydroxy  . Ambulatory referral to Gastroenterology

## 2016-05-26 NOTE — Assessment & Plan Note (Signed)
Deteriorated due to non compliance. Discussed importance of taking her blood pressure medication daily.

## 2016-05-26 NOTE — Progress Notes (Signed)
Pre visit review using our clinic review tool, if applicable. No additional management support is needed unless otherwise documented below in the visit note. 

## 2016-05-26 NOTE — Progress Notes (Signed)
Subjective:   Patient ID: Jasmine Benton, female    DOB: 27-Aug-1965, 51 y.o.   MRN: [id number]  Jasmine Benton is a pleasant 51 y.o. year old female who presents to clinic today with Annual Exam  on 05/26/2016  HPI:  Last pap smear was done by me on 05/26/15. Mammogram 07/10/15  HTN- BP has been well controlled on hyzaar but she has not been taking it.  "just forgetting." .  Lab Results  Component Value Date   CREATININE 0.85 05/22/2015   Lab Results  Component Value Date   CHOL 173 05/22/2015   HDL 51.10 05/22/2015   LDLCALC 107 (H) 05/22/2015   TRIG 74.0 05/22/2015   CHOLHDL 3 05/22/2015   Lab Results  Component Value Date   TSH 4.28 05/22/2015   Lab Results  Component Value Date   WBC 6.5 05/22/2015   HGB 12.9 05/22/2015   HCT 38.6 05/22/2015   MCV 85.8 05/22/2015   PLT 243.0 05/22/2015   Current Outpatient Prescriptions on File Prior to Visit  Medication Sig Dispense Refill  . fexofenadine (ALLEGRA) 180 MG tablet Take 180 mg by mouth as needed for allergies or rhinitis.    Marland Kitchen losartan-hydrochlorothiazide (HYZAAR) 50-12.5 MG tablet Take 1 tablet by mouth daily. 90 tablet 3  . montelukast (SINGULAIR) 10 MG tablet TAKE 1 TABLET (10 MG TOTAL) BY MOUTH AT BEDTIME. 30 tablet 3  . PROAIR HFA 108 (90 Base) MCG/ACT inhaler INHALE 1-2 PUFFS INTO THE LUNGS EVERY 6 (SIX) HOURS AS NEEDED FOR WHEEZING OR SHORTNESS OF BREATH. 8.5 Inhaler 0   No current facility-administered medications on file prior to visit.     Allergies  Allergen Reactions  . Ace Inhibitors Cough  . Penicillins     Past Medical History:  Diagnosis Date  . Allergic rhinitis   . Anemia   . Depression   . Glucose intolerance (pre-diabetes)     with obesity, and hypertension = metabolic syndrome   . Hyperlipidemia   . Obesity   . Vitamin D deficiency     Past Surgical History:  Procedure Laterality Date  . Exercise STRESS ECHO  03/12/2013   Normal Function; No ischemia or infarction.  . TOOTH  EXTRACTION      Family History  Problem Relation Age of Onset  . Alzheimer's disease Mother   . Heart disease Mother   . Heart attack Mother 66  . Diabetes Father        Type II  . Hypertension Father   . Pneumonia Father   . Heart attack Father 48  . Hypertension Brother   . Asthma Son     Social History   Social History  . Marital status: Married    Spouse name: N/A  . Number of children: N/A  . Years of education: N/A   Occupational History  . Not on file.   Social History Main Topics  . Smoking status: Never Smoker  . Smokeless tobacco: Never Used  . Alcohol use Yes  . Drug use: No  . Sexual activity: Yes   Other Topics Concern  . Not on file   Social History Narrative   She works full-time in for [employer]. She's been there for 15 years. She has one year of college after graduating from high school.   She is married with 2 children, she lives with her husband 2 children and mother-in-law. She exercises roughly twice a week doing walking.    She never smoked, and  does not drink alcohol.   The PMH, PSH, Social History, Family History, Medications, and allergies have been [facility], and have been updated if relevant.   Review of Systems  Constitutional: Negative.   HENT: Negative.   Eyes: Negative.   Respiratory: Negative.   Cardiovascular: Negative.   Gastrointestinal: Negative.   Endocrine: Negative.   Genitourinary: Negative.   Musculoskeletal: Negative.   Skin: Negative.   Allergic/Immunologic: Negative.   Neurological: Negative.   Hematological: Negative.   Psychiatric/Behavioral: Negative.   All other systems reviewed and are negative.      Objective:    BP (!) 148/90   Pulse 88   Ht 5' 2.5" (1.588 m)   Wt 221 lb 8 oz (100.5 kg)   LMP 05/03/2016   SpO2 98%   BMI 39.87 kg/m    Physical Exam    General:  Well-developed,well-nourished,in no acute distress; alert,appropriate and cooperative throughout  examination Head:  normocephalic and atraumatic.   Eyes:  vision grossly intact, pupils equal, pupils round, and pupils reactive to light.   Ears:  R ear normal and L ear normal.   Nose:  no external deformity.   Mouth:  good dentition.   Neck:  No deformities, masses, or tenderness noted. Breasts:  No mass, nodules, thickening, tenderness, bulging, retraction, inflamation, nipple discharge or skin changes noted.   Lungs:  Normal respiratory effort, chest expands symmetrically. Lungs are clear to auscultation, no crackles or wheezes. Heart:  Normal rate and regular rhythm. S1 and S2 normal without gallop, murmur, click, rub or other extra sounds. Abdomen:  Bowel sounds positive,abdomen soft and non-tender without masses, organomegaly or hernias noted. Msk:  No deformity or scoliosis noted of thoracic or lumbar spine.   Extremities:  No clubbing, cyanosis, edema, or deformity noted with normal full range of motion of all joints.   Neurologic:  alert & oriented X3 and gait normal.   Skin:  Intact without suspicious lesions or rashes Cervical Nodes:  No lymphadenopathy noted Axillary Nodes:  No palpable lymphadenopathy Psych:  Cognition and judgment appear intact. Alert and cooperative with normal attention span and concentration. No apparent delusions, illusions, hallucinations      Assessment & Plan:   Well woman exam - Plan: CBC with Differential/Platelet, Comprehensive metabolic panel, Lipid panel, TSH, HIV antibody (with reflex), Vitamin D, 25-hydroxy  Essential hypertension No Follow-up on file.

## 2016-05-26 NOTE — Patient Instructions (Signed)
Great to see you. Please restart your blood pressure medication.  We will call with your lab results and you can view them online.  We are referring you for a colonoscopy as well.

## 2016-05-27 ENCOUNTER — Other Ambulatory Visit: Payer: Self-pay | Admitting: Family Medicine

## 2016-05-27 LAB — HIV ANTIBODY (ROUTINE TESTING W REFLEX): HIV: NONREACTIVE

## 2016-05-27 MED ORDER — VITAMIN D (ERGOCALCIFEROL) 1.25 MG (50000 UNIT) PO CAPS: 50000.0000 [IU] | ORAL_CAPSULE | ORAL | 0 refills | Status: DC

## 2016-06-09 ENCOUNTER — Encounter: Payer: Self-pay | Admitting: Gastroenterology

## 2016-06-18 ENCOUNTER — Other Ambulatory Visit: Payer: Self-pay | Admitting: Family Medicine

## 2016-07-20 ENCOUNTER — Other Ambulatory Visit: Payer: Self-pay | Admitting: Family Medicine

## 2016-08-15 ENCOUNTER — Encounter: Payer: Self-pay | Admitting: Gastroenterology

## 2016-09-19 ENCOUNTER — Telehealth: Payer: Self-pay

## 2016-09-19 NOTE — Telephone Encounter (Signed)
Patient no showed for the second time for PV. Patient was called to reschedule. No answer. A message was left requesting her to call and reschedule her appointment. Patient was notified that if we did not hear back from her by 5:00 Pm today we would cancel her colonoscopy that is scheduled for 10/03/16.    Jasmine Dane, LPN  ( PV )

## 2016-09-20 ENCOUNTER — Encounter: Payer: Self-pay | Admitting: Family Medicine

## 2016-10-03 ENCOUNTER — Encounter: Payer: Self-pay | Admitting: Gastroenterology

## 2016-10-06 ENCOUNTER — Ambulatory Visit (INDEPENDENT_AMBULATORY_CARE_PROVIDER_SITE_OTHER): Payer: Managed Care, Other (non HMO) | Admitting: Family Medicine

## 2016-10-06 ENCOUNTER — Encounter: Payer: Self-pay | Admitting: Family Medicine

## 2016-10-06 VITALS — BP 122/84 | HR 79 | Temp 98.1°F | Wt 222.8 lb

## 2016-10-06 DIAGNOSIS — N898 Other specified noninflammatory disorders of vagina: Secondary | ICD-10-CM | POA: Diagnosis not present

## 2016-10-06 NOTE — Progress Notes (Signed)
SUBJECTIVE:  51 y.o. female complains of white, copious and creamy vaginal discharge for 5 day(s). Denies abnormal vaginal bleeding or significant pelvic pain or fever. No UTI symptoms. Denies history of known exposure to STD.  Current Outpatient Prescriptions on File Prior to Visit  Medication Sig Dispense Refill  . albuterol (PROAIR HFA) 108 (90 Base) MCG/ACT inhaler Inhale 1-2 puffs into the lungs every 6 (six) hours as needed for wheezing or shortness of breath. 18 g 5  . fexofenadine (ALLEGRA) 180 MG tablet Take 180 mg by mouth as needed for allergies or rhinitis.    Marland Kitchen losartan-hydrochlorothiazide (HYZAAR) 50-12.5 MG tablet Take 1 tablet by mouth daily. 90 tablet 3  . montelukast (SINGULAIR) 10 MG tablet TAKE 1 TABLET (10 MG TOTAL) BY MOUTH AT BEDTIME. 30 tablet 9  . PROAIR HFA 108 (90 Base) MCG/ACT inhaler INHALE 1-2 PUFFS INTO THE LUNGS EVERY 6 (SIX) HOURS AS NEEDED FOR WHEEZING OR SHORTNESS OF BREATH. 8.5 Inhaler 0  . Vitamin D, Ergocalciferol, (DRISDOL) 50000 units CAPS capsule Take 1 capsule (50,000 Units total) by mouth every 7 (seven) days. 6 capsule 0   No current facility-administered medications on file prior to visit.     Allergies  Allergen Reactions  . Ace Inhibitors Cough  . Penicillins     Past Medical History:  Diagnosis Date  . Allergic rhinitis   . Anemia   . Depression   . Glucose intolerance (pre-diabetes)     with obesity, and hypertension = metabolic syndrome   . Hyperlipidemia   . Obesity   . Vitamin D deficiency     Past Surgical History:  Procedure Laterality Date  . Exercise STRESS ECHO  03/12/2013   Normal Function; No ischemia or infarction.  . TOOTH EXTRACTION      Family History  Problem Relation Age of Onset  . Alzheimer's disease Mother   . Heart disease Mother   . Heart attack Mother 61  . Diabetes Father        Type II  . Hypertension Father   . Pneumonia Father   . Heart attack Father 78  . Hypertension Brother   . Asthma Son      Social History   Social History  . Marital status: Married    Spouse name: N/A  . Number of children: N/A  . Years of education: N/A   Occupational History  . Not on file.   Social History Main Topics  . Smoking status: Never Smoker  . Smokeless tobacco: Never Used  . Alcohol use Yes  . Drug use: No  . Sexual activity: Yes   Other Topics Concern  . Not on file   Social History Narrative   She works full-time in for [employer]. She's been there for 15 years. She has one year of college after graduating from high school.   She is married with 2 children, she lives with her husband 2 children and mother-in-law. She exercises roughly twice a week doing walking.    She never smoked, and does not drink alcohol.   The PMH, PSH, Social History, Family History, Medications, and allergies have been reviewed in [facility], and have been updated if relevant.  Patient's last menstrual period was 09/17/2016.  OBJECTIVE:   BP 122/84 (BP Location: Right Arm, Patient Position: Sitting, Cuff Size: Normal)   Pulse 79   Temp 98.1 F (36.7 C) (Oral)   Wt 222 lb 12 oz (101 kg)   LMP 09/17/2016   SpO2 98%  BMI 40.09 kg/m   She appears well, afebrile. Abdomen: benign, soft, nontender, no masses. Pelvic Exam: VULVA: normal appearing vulva with no masses, tenderness or lesions. Some thick white discharge in the vault Urine dipstick: not done.  ASSESSMENT:  nonspecific vaginitis  PLAN:  GC and chlamydia DNA  probe sent to lab.  Wet prep sent as well.

## 2016-10-06 NOTE — Addendum Note (Signed)
Addended by: Gregery Na on: 10/06/2016 08:49 AM   Modules accepted: Orders

## 2016-10-07 ENCOUNTER — Other Ambulatory Visit: Payer: Self-pay | Admitting: Family Medicine

## 2016-10-07 LAB — WET PREP BY MOLECULAR PROBE
Candida species: NOT DETECTED
MICRO NUMBER: 81104279
SPECIMEN QUALITY:: ADEQUATE
TRICHOMONAS VAG: NOT DETECTED

## 2016-10-07 LAB — C. TRACHOMATIS/N. GONORRHOEAE RNA
C. TRACHOMATIS RNA, TMA: NOT DETECTED
N. GONORRHOEAE RNA, TMA: NOT DETECTED

## 2016-10-07 MED ORDER — METRONIDAZOLE 500 MG PO TABS: 500.0000 mg | ORAL_TABLET | Freq: Two times a day (BID) | ORAL | 0 refills | Status: DC

## 2016-11-01 ENCOUNTER — Encounter: Payer: Self-pay | Admitting: Family Medicine

## 2016-11-01 ENCOUNTER — Ambulatory Visit (INDEPENDENT_AMBULATORY_CARE_PROVIDER_SITE_OTHER): Payer: Managed Care, Other (non HMO) | Admitting: Family Medicine

## 2016-11-01 DIAGNOSIS — J45909 Unspecified asthma, uncomplicated: Secondary | ICD-10-CM | POA: Insufficient documentation

## 2016-11-01 DIAGNOSIS — J4540 Moderate persistent asthma, uncomplicated: Secondary | ICD-10-CM | POA: Diagnosis not present

## 2016-11-01 DIAGNOSIS — J3089 Other allergic rhinitis: Secondary | ICD-10-CM | POA: Diagnosis not present

## 2016-11-01 DIAGNOSIS — J309 Allergic rhinitis, unspecified: Secondary | ICD-10-CM | POA: Insufficient documentation

## 2016-11-01 MED ORDER — FLUTICASONE-SALMETEROL 45-21 MCG/ACT IN AERO: 2.0000 | INHALATION_SPRAY | Freq: Two times a day (BID) | RESPIRATORY_TRACT | 12 refills | Status: DC

## 2016-11-01 NOTE — Patient Instructions (Signed)
Great to see you!   

## 2016-11-01 NOTE — Assessment & Plan Note (Signed)
Deteriorated with scattered wheezes on exam. Having to use proair rescue inhaler more often. Will start inhaled steroid, refer to pulmonary. Continue singulair and as needed proair. The patient indicates understanding of these issues and agrees with the plan.

## 2016-11-01 NOTE — Progress Notes (Signed)
Subjective:   Patient ID: Jasmine Benton, female    DOB: 22-Dec-1965, 51 y.o.   MRN: [id number]  Jasmine Benton is a pleasant 51 y.o. year old female who presents to clinic today with Wheezing (Patient is here today C/O wheezing.  She states that she has had this in the past but it seems more often. She says that she has a lot of allergies.)  on 11/01/2016  HPI:  Wheezing- has a history of RAD, triggered by seasonal allergies.  Taking Singulair daily.  Feels that she is wheezing more often and that her allergy symptoms are also worse.  Proair as needed does help with acute symptoms. Having to use this twice daily now, rarely wakes her up at night.  Current Outpatient Prescriptions on File Prior to Visit  Medication Sig Dispense Refill  . losartan-hydrochlorothiazide (HYZAAR) 50-12.5 MG tablet Take 1 tablet by mouth daily. 90 tablet 3  . montelukast (SINGULAIR) 10 MG tablet TAKE 1 TABLET (10 MG TOTAL) BY MOUTH AT BEDTIME. 30 tablet 9  . PROAIR HFA 108 (90 Base) MCG/ACT inhaler INHALE 1-2 PUFFS INTO THE LUNGS EVERY 6 (SIX) HOURS AS NEEDED FOR WHEEZING OR SHORTNESS OF BREATH. 8.5 Inhaler 0  . Vitamin D, Ergocalciferol, (DRISDOL) 50000 units CAPS capsule Take 1 capsule (50,000 Units total) by mouth every 7 (seven) days. 6 capsule 0   No current facility-administered medications on file prior to visit.     Allergies  Allergen Reactions  . Ace Inhibitors Cough  . Penicillins     Past Medical History:  Diagnosis Date  . Allergic rhinitis   . Anemia   . Depression   . Glucose intolerance (pre-diabetes)     with obesity, and hypertension = metabolic syndrome   . Hyperlipidemia   . Obesity   . Vitamin D deficiency     Past Surgical History:  Procedure Laterality Date  . Exercise STRESS ECHO  03/12/2013   Normal Function; No ischemia or infarction.  . TOOTH EXTRACTION      Family History  Problem Relation Age of Onset  . Alzheimer's disease Mother   . Heart disease Mother   .  Heart attack Mother [id number]  . Diabetes Father        Type II  . Hypertension Father   . Pneumonia Father   . Heart attack Father 2670  . Hypertension Brother   . Asthma Son     Social History   Social History  . Marital status: Married    Spouse name: N/A  . Number of children: N/A  . Years of education: N/A   Occupational History  . Not on file.   Social History Main Topics  . Smoking status: Never Smoker  . Smokeless tobacco: Never Used  . Alcohol use Yes  . Drug use: No  . Sexual activity: Yes   Other Topics Concern  . Not on file   Social History Narrative   She works full-time in for [employer]. She's been there for 15 years. She has one year of college after graduating from high school.   She is married with 2 children, she lives with her husband 2 children and mother-in-law. She exercises roughly twice a week doing walking.    She never smoked, and does not drink alcohol.   The PMH, PSH, Social History, Family History, Medications, and allergies [facility], and have been updated if relevant.      Review of Systems  Constitutional:  Negative.   HENT: Positive for congestion, rhinorrhea and sneezing.   Respiratory: Positive for cough and wheezing. Negative for apnea, choking, chest tightness, shortness of breath and stridor.   All other systems reviewed and are negative.      Objective:    BP 136/86 (BP Location: Right Arm, Patient Position: Sitting, Cuff Size: Normal)   Pulse 79   Temp 98 F (36.7 C) (Oral)   Ht 5\' 3"  (1.6 m)   Wt 224 lb 1.9 oz (101.7 kg)   LMP 10/02/2016   SpO2 98%   BMI 39.70 kg/m    Physical Exam  Constitutional: She is oriented to person, place, and time. She appears well-developed and well-nourished. No distress.  HENT:  Head: Normocephalic and atraumatic.  Eyes: Conjunctivae are normal.  Pulmonary/Chest: Effort normal. She has wheezes.  Musculoskeletal: Normal range of motion.  Neurological: She is  alert and oriented to person, place, and time. No cranial nerve deficit.  Skin: Skin is warm and dry. She is not diaphoretic.  Psychiatric: She has a normal mood and affect. Her behavior is normal. Thought content normal.  Nursing note and vitals reviewed.         Assessment & Plan:   Moderate persistent reactive airway disease without complication - Plan: Ambulatory referral to Pulmonology  Allergic rhinitis due to other allergic trigger, unspecified seasonality No Follow-up on file.

## 2016-11-02 NOTE — Progress Notes (Signed)
[facility] Consultation      Assessment and Plan:  51 year old female with symptoms of asthma.  Asthma, suspect related to allergies.. -Persistent, will start Advair.  She is asked to continue Singulair, and will add a daily antihistamine in addition to this. - She is asked to limit exposure to her dog around which she notices her breathing is worse. - We will check a chest x-ray as a baseline test today, and follow her up in 3 months.  She is asked to call us back in 3-4 weeks if she is not doing better to reassess. -We will administer flu vaccine today.  GERD. - May be contributing to asthma - She is asked to start an over-the-counter PPI such as omeprazole once daily. -Asked to avoid eating 4 hours before bedtime and drinking 3 hours before bedtime.  Orders Placed This Encounter  Procedures  . DG Chest 2 View   Return in about 3 months (around 02/03/2017).    Date: 11/03/2016  MRN# [id number] Jasmine Benton 1965-08-04   Jasmine Benton is a 51 y.o. old female seen in consultation for chief complaint of:    Chief Complaint  Patient presents with  . Advice Only    Referred by Dr. Enos Flingalia Aaron for Reactive airway Disease.  Marland Kitchen. Shortness of Breath    with exhertion.   . Wheezing  . Cough    pt does have pets in the home and notices these sx more.    HPI:   She tells me that about a year ago she was having dyspnea, and there was a thought that she had  allergies. She was started on an allegra, which seemed to help, then switched to singulair earlier this year. She did well with that for a while.  Over the past few months her symptoms have gotten worse, her mother has COPD. She has never smoked but her mother smoked. No occupational exposures.  She has dog at home and notices that her breathing and wheezing is worse, she also has more trouble when vacuuming.  She has recently been prescribed advair, but has not started. She was prescribed albuterol, which she  uses 2 or 3 times per day and notes that it helps.  She has stuffy nose. She has heartburn, does not take anything regularly, but takes zantac about once every 2 weeks.   Her weight has remained stable in the past 2 years.  Her daughter has allergies.  She denies excessive daytime sleepiness.  Review of lab studies, CBC 05/26/16; eosinophils equals 300.   PMHX:   Past Medical History:  Diagnosis Date  . Allergic rhinitis   . Anemia   . Depression   . Glucose intolerance (pre-diabetes)     with obesity, and hypertension = metabolic syndrome   . Hyperlipidemia   . Obesity   . Vitamin D deficiency    Surgical Hx:  Past Surgical History:  Procedure Laterality Date  . Exercise STRESS ECHO  03/12/2013   Normal Function; No ischemia or infarction.  . TOOTH EXTRACTION     Family Hx:  Family History  Problem Relation Age of Onset  . Alzheimer's disease Mother   . Heart disease Mother   . Heart attack Mother 1878  . Diabetes Father        Type II  . Hypertension Father   . Pneumonia Father   . Heart attack Father [id number]  . Hypertension Brother   . Asthma Son    Social  Hx:   Social History  Substance Use Topics  . Smoking status: Never Smoker  . Smokeless tobacco: Never Used  . Alcohol use Yes   Medication:    Current Outpatient Prescriptions:  .  fluticasone-salmeterol (ADVAIR HFA) 45-21 MCG/ACT inhaler, Inhale 2 puffs into the lungs 2 (two) times daily., Disp: 1 Inhaler, Rfl: 12 .  losartan-hydrochlorothiazide (HYZAAR) 50-12.5 MG tablet, Take 1 tablet by mouth daily., Disp: 90 tablet, Rfl: 3 .  montelukast (SINGULAIR) 10 MG tablet, TAKE 1 TABLET (10 MG TOTAL) BY MOUTH AT BEDTIME., Disp: 30 tablet, Rfl: 9 .  PROAIR HFA 108 (90 Base) MCG/ACT inhaler, INHALE 1-2 PUFFS INTO THE LUNGS EVERY 6 (SIX) HOURS AS NEEDED FOR WHEEZING OR SHORTNESS OF BREATH., Disp: 8.5 Inhaler, Rfl: 0   Allergies:  Ace inhibitors and Penicillins  Review of Systems: Gen:  Denies  fever, sweats,  chills HEENT: Denies blurred vision, double vision. bleeds, sore throat Cvc:  No dizziness, chest pain. Resp:   Denies cough or sputum production, shortness of breath Gi: Denies swallowing difficulty, stomach pain. Gu:  Denies bladder incontinence, burning urine Ext:   No Joint pain, stiffness. Skin: No skin rash,  hives  Endoc:  No polyuria, polydipsia. Psych: No depression, insomnia. Other:  All other systems were reviewed with the patient and were negative other that what is mentioned in the HPI.   Physical Examination:   VS: BP 122/80 (BP Location: Left Arm, Cuff Size: Normal)   Pulse 87   Resp 16   Ht 5\' 3"  (1.6 m)   Wt 222 lb (100.7 kg)   SpO2 97%   BMI 39.33 kg/m   General Appearance: No distress  Neuro:without focal findings,  speech normal,  HEENT: PERRLA, EOM intact.   Pulmonary: normal breath sounds, No wheezing.  CardiovascularNormal S1,S2.  No m/r/g.   Abdomen: Benign, Soft, non-tender. Renal:  No costovertebral tenderness  GU:  No performed at this time. Endoc: No evident thyromegaly, no signs of acromegaly. Skin:   warm, no rashes, no ecchymosis  Extremities: normal, no cyanosis, clubbing.  Other findings:    LABORATORY PANEL:   CBC No results for input(s): WBC, HGB, HCT, PLT in the last 168 hours. ------------------------------------------------------------------------------------------------------------------  Chemistries  No results for input(s): NA, K, CL, CO2, GLUCOSE, BUN, CREATININE, CALCIUM, MG, AST, ALT, ALKPHOS, BILITOT in the last 168 hours.  Invalid input(s): GFRCGP ------------------------------------------------------------------------------------------------------------------  Cardiac Enzymes No results for input(s): TROPONINI in the last 168 hours. ------------------------------------------------------------  RADIOLOGY:  No results found.     Thank  you for the consultation and for allowing [facility]  to assist in the care of your patient. Our recommendations are noted above.  Please contact us if we can be of further service.   Wells Guiles, MD.  Board Certified in Internal Medicine, Pulmonary Medicine, Critical Care Medicine, and Sleep Medicine.  [facility] Office Number: [phone]  Santiago Glad, M.D.  Billy Fischer, M.D  11/03/2016

## 2016-11-03 ENCOUNTER — Ambulatory Visit
Admission: RE | Admit: 2016-11-03 | Discharge: 2016-11-03 | Disposition: A | Discharge status: Home / Self Care | Payer: Managed Care, Other (non HMO) | Source: Ambulatory Visit | Attending: Internal Medicine | Admitting: Internal Medicine

## 2016-11-03 ENCOUNTER — Ambulatory Visit (INDEPENDENT_AMBULATORY_CARE_PROVIDER_SITE_OTHER): Payer: Managed Care, Other (non HMO) | Admitting: Internal Medicine

## 2016-11-03 ENCOUNTER — Encounter: Payer: Self-pay | Admitting: Internal Medicine

## 2016-11-03 VITALS — BP 122/80 | HR 87 | Resp 16 | Ht 63.0 in | Wt 222.0 lb

## 2016-11-03 DIAGNOSIS — R918 Other nonspecific abnormal finding of lung field: Secondary | ICD-10-CM | POA: Diagnosis not present

## 2016-11-03 DIAGNOSIS — J4540 Moderate persistent asthma, uncomplicated: Secondary | ICD-10-CM

## 2016-11-03 DIAGNOSIS — Z23 Encounter for immunization: Secondary | ICD-10-CM | POA: Diagnosis not present

## 2016-11-03 MED ORDER — OMEPRAZOLE 20 MG PO CPDR: 20.0000 mg | DELAYED_RELEASE_CAPSULE | Freq: Every day | ORAL | 2 refills | Status: DC

## 2016-11-03 NOTE — Patient Instructions (Addendum)
--  Restart an anthistamine [employer](allegra or similar).  --Start advair. Rinse mouth after use.  --limit exposure to pets.  --Start omprazole (prilosec) once daily.   --Avoid eating about 4 hours before bedtime and drinking 3 hours before bedtime.   --Chest xray as baseline test.   --If not doing better in next 3-4 weeks please call us back.

## 2016-11-03 NOTE — Addendum Note (Signed)
Addended by: Janean SarkSNIPES, SONYA K on: 11/03/2016 09:22 AM   Modules accepted: Orders

## 2016-11-03 NOTE — Addendum Note (Signed)
Addended by: Janean SarkSNIPES, SONYA K on: 11/03/2016 02:01 PM   Modules accepted: Orders

## 2017-03-01 ENCOUNTER — Other Ambulatory Visit: Payer: Self-pay | Admitting: Internal Medicine

## 2017-06-01 ENCOUNTER — Encounter: Payer: Self-pay | Admitting: Family Medicine

## 2017-06-06 ENCOUNTER — Encounter: Payer: Self-pay | Admitting: Family Medicine

## 2017-06-12 ENCOUNTER — Other Ambulatory Visit: Payer: Self-pay | Admitting: Internal Medicine

## 2017-06-19 ENCOUNTER — Other Ambulatory Visit: Payer: Self-pay | Admitting: Family Medicine

## 2017-06-21 ENCOUNTER — Encounter: Payer: Self-pay | Admitting: Family Medicine

## 2017-06-21 ENCOUNTER — Other Ambulatory Visit (HOSPITAL_COMMUNITY)
Admission: RE | Admit: 2017-06-21 | Discharge: 2017-06-21 | Disposition: A | Discharge status: Home / Self Care | Payer: Managed Care, Other (non HMO) | Source: Ambulatory Visit | Attending: Family Medicine | Admitting: Family Medicine

## 2017-06-21 ENCOUNTER — Ambulatory Visit (INDEPENDENT_AMBULATORY_CARE_PROVIDER_SITE_OTHER): Payer: Managed Care, Other (non HMO) | Admitting: Family Medicine

## 2017-06-21 VITALS — BP 116/76 | HR 72 | Temp 98.5°F | Ht 62.75 in | Wt 227.0 lb

## 2017-06-21 DIAGNOSIS — B9689 Other specified bacterial agents as the cause of diseases classified elsewhere: Secondary | ICD-10-CM | POA: Diagnosis not present

## 2017-06-21 DIAGNOSIS — Z1239 Encounter for other screening for malignant neoplasm of breast: Secondary | ICD-10-CM

## 2017-06-21 DIAGNOSIS — Z1231 Encounter for screening mammogram for malignant neoplasm of breast: Secondary | ICD-10-CM

## 2017-06-21 DIAGNOSIS — Z23 Encounter for immunization: Secondary | ICD-10-CM

## 2017-06-21 DIAGNOSIS — N898 Other specified noninflammatory disorders of vagina: Secondary | ICD-10-CM

## 2017-06-21 DIAGNOSIS — Z01419 Encounter for gynecological examination (general) (routine) without abnormal findings: Secondary | ICD-10-CM | POA: Diagnosis not present

## 2017-06-21 DIAGNOSIS — I10 Essential (primary) hypertension: Secondary | ICD-10-CM

## 2017-06-21 DIAGNOSIS — N760 Acute vaginitis: Secondary | ICD-10-CM | POA: Diagnosis not present

## 2017-06-21 LAB — COMPREHENSIVE METABOLIC PANEL
ALK PHOS: 58 U/L (ref 39–117)
ALT: 15 U/L (ref 0–35)
AST: 12 U/L (ref 0–37)
Albumin: 3.8 g/dL (ref 3.5–5.2)
BUN: 13 mg/dL (ref 6–23)
CHLORIDE: 104 meq/L (ref 96–112)
CO2: 30 meq/L (ref 19–32)
Calcium: 9.4 mg/dL (ref 8.4–10.5)
Creatinine, Ser: 0.83 mg/dL (ref 0.40–1.20)
GFR: 93.24 mL/min (ref >60.00)
GLUCOSE: 102 mg/dL — AB (ref 70–99)
POTASSIUM: 3.8 meq/L (ref 3.5–5.1)
SODIUM: 140 meq/L (ref 135–145)
Total Bilirubin: 0.5 mg/dL (ref 0.2–1.2)
Total Protein: 6.9 g/dL (ref 6.0–8.3)

## 2017-06-21 LAB — LIPID PANEL
CHOL/HDL RATIO: 3
Cholesterol: 175 mg/dL (ref 0–200)
HDL: 67.7 mg/dL (ref >39.00)
LDL CALC: 92 mg/dL (ref 0–99)
NONHDL: 107.29
Triglycerides: 76 mg/dL (ref 0.0–149.0)
VLDL: 15.2 mg/dL (ref 0.0–40.0)

## 2017-06-21 LAB — CBC WITH DIFFERENTIAL/PLATELET
Basophils Absolute: 0 10*3/uL (ref 0.0–0.1)
Basophils Relative: 0.3 % (ref 0.0–3.0)
EOS PCT: 2.2 % (ref 0.0–5.0)
Eosinophils Absolute: 0.2 10*3/uL (ref 0.0–0.7)
HCT: 38.2 % (ref 36.0–46.0)
Hemoglobin: 12.4 g/dL (ref 12.0–15.0)
LYMPHS ABS: 2.8 10*3/uL (ref 0.7–4.0)
Lymphocytes Relative: 36.2 % (ref 12.0–46.0)
MCHC: 32.4 g/dL (ref 30.0–36.0)
MCV: 87 fl (ref 78.0–100.0)
MONO ABS: 0.6 10*3/uL (ref 0.1–1.0)
Monocytes Relative: 7.5 % (ref 3.0–12.0)
NEUTROS PCT: 53.8 % (ref 43.0–77.0)
Neutro Abs: 4.2 10*3/uL (ref 1.4–7.7)
PLATELETS: 273 10*3/uL (ref 150.0–400.0)
RBC: 4.39 Mil/uL (ref 3.87–5.11)
RDW: 14.3 % (ref 11.5–15.5)
WBC: 7.8 10*3/uL (ref 4.0–10.5)

## 2017-06-21 LAB — TSH: TSH: 3.83 u[IU]/mL (ref 0.35–4.50)

## 2017-06-21 MED ORDER — LOSARTAN POTASSIUM-HCTZ 50-12.5 MG PO TABS: 1.0000 | ORAL_TABLET | Freq: Every day | ORAL | 3 refills | Status: DC

## 2017-06-21 NOTE — Assessment & Plan Note (Signed)
Well controlled. No changes made to rxs. Labs today.

## 2017-06-21 NOTE — Progress Notes (Signed)
Subjective:   Patient ID: Jasmine Benton, female    DOB: March 10, 1965, 52 y.o.   MRN: [id number]  Jasmine Benton is a pleasant 52 y.o. year old female who presents to clinic today with Annual Exam (Patient is here today for a CPE with PAP.  She is having vaginal D/C and itching.  She is currently fasting. She is due for her Mammogram in July at [place] and will call if order is created.  She agrees to [employer].  She also agrees to Tdap.)  on 06/21/2017  HPI:  Health Maintenance  Topic Date Due  . TETANUS/TDAP  07/28/1985  . COLONOSCOPY  07/28/2016  . MAMMOGRAM  07/09/2017  . INFLUENZA VACCINE  08/03/2017  . PAP SMEAR  05/26/2018  . HIV Screening  Completed    Last pap smear was done by me on 05/26/15. She is having some intermittent vaginal discharge, sometimes itches, no burning.  Mammogram 07/10/15  HTN- BP has been well controlled on hyzaar. .  Lab Results  Component Value Date   CREATININE 0.89 05/26/2016   Lab Results  Component Value Date   CHOL 160 05/26/2016   HDL 55.00 05/26/2016   LDLCALC 91 05/26/2016   TRIG 72.0 05/26/2016   CHOLHDL 3 05/26/2016   Lab Results  Component Value Date   TSH 3.80 05/26/2016   Lab Results  Component Value Date   WBC 7.2 05/26/2016   HGB 13.1 05/26/2016   HCT 39.7 05/26/2016   MCV 86.6 05/26/2016   PLT 239.0 05/26/2016   Current Outpatient Medications on File Prior to Visit  Medication Sig Dispense Refill  . fluticasone-salmeterol (ADVAIR HFA) 45-21 MCG/ACT inhaler Inhale 2 puffs into the lungs 2 (two) times daily. 1 Inhaler 12  . losartan-hydrochlorothiazide (HYZAAR) 50-12.5 MG tablet TAKE 1 TABLET BY MOUTH EVERY DAY 90 tablet 0  . montelukast (SINGULAIR) 10 MG tablet TAKE 1 TABLET (10 MG TOTAL) BY MOUTH AT BEDTIME. 30 tablet 9  . omeprazole (PRILOSEC) 20 MG capsule TAKE 1 CAPSULE BY MOUTH EVERY DAY 30 capsule 0  . PROAIR HFA 108 (90 Base) MCG/ACT inhaler INHALE 1-2 PUFFS INTO THE LUNGS EVERY 6 (SIX) HOURS AS NEEDED FOR  WHEEZING OR SHORTNESS OF BREATH. 8.5 Inhaler 0   No current facility-administered medications on file prior to visit.     Allergies  Allergen Reactions  . Ace Inhibitors Cough  . Penicillins     Past Medical History:  Diagnosis Date  . Allergic rhinitis   . Anemia   . Depression   . Glucose intolerance (pre-diabetes)     with obesity, and hypertension = metabolic syndrome   . Hyperlipidemia   . Obesity   . Vitamin D deficiency     Past Surgical History:  Procedure Laterality Date  . Exercise STRESS ECHO  03/12/2013   Normal Function; No ischemia or infarction.  . TOOTH EXTRACTION      Family History  Problem Relation Age of Onset  . Alzheimer's disease Mother   . Heart disease Mother   . Heart attack Mother 64  . Diabetes Father        Type II  . Hypertension Father   . Pneumonia Father   . Heart attack Father 64  . Hypertension Brother   . Asthma Son     Social History   Socioeconomic History  . Marital status: Married    Spouse name: Not on file  . Number of children: Not on file  . Years of education: Not  on file  . Highest education level: Not on file  Occupational History  . Not on file  Social Needs  . Financial resource strain: Not on file  . Food insecurity:    Worry: Not on file    Inability: Not on file  . Transportation needs:    Medical: Not on file    Non-medical: Not on file  Tobacco Use  . Smoking status: Never Smoker  . Smokeless tobacco: Never Used  Substance and Sexual Activity  . Alcohol use: Yes  . Drug use: No  . Sexual activity: Yes  Lifestyle  . Physical activity:    Days per week: Not on file    Minutes per session: Not on file  . Stress: Not on file  Relationships  . Social connections:    Talks on phone: Not on file    Gets together: Not on file    Attends religious service: Not on file    Active member of club or organization: Not on file    Attends meetings of clubs or organizations: Not on file     Relationship status: Not on file  . Intimate partner violence:    Fear of current or ex partner: Not on file    Emotionally abused: Not on file    Physically abused: Not on file    Forced sexual activity: Not on file  Other Topics Concern  . Not on file  Social History Narrative   She works full-time in for [employer]. She's been there for 15 years. She has one year of college after graduating from high school.   She is married with 2 children, she lives with her husband 2 children and mother-in-law. She exercises roughly twice a week doing walking.    She never smoked, and does not drink alcohol.   The PMH, PSH, Social History, Family History, Medications, and allergies have been reviewed in [facility], and have been updated if relevant.   Review of Systems  Constitutional: Negative.   HENT: Negative.   Eyes: Negative.   Respiratory: Negative.   Cardiovascular: Negative.   Gastrointestinal: Negative.   Endocrine: Negative.   Genitourinary: Positive for vaginal discharge. Negative for decreased urine volume, difficulty urinating, dyspareunia, dysuria, flank pain, genital sores, hematuria, menstrual problem, pelvic pain, urgency and vaginal bleeding.  Musculoskeletal: Negative.   Skin: Negative.   Allergic/Immunologic: Negative.   Neurological: Negative.   Hematological: Negative.   Psychiatric/Behavioral: Negative.   All other systems reviewed and are negative.      Objective:    BP 116/76 (BP Location: Left Arm, Patient Position: Sitting, Cuff Size: Normal)   Pulse 72   Temp 98.5 F (36.9 C) (Oral)   Ht 5' 2.75" (1.594 m)   Wt 227 lb (103 kg)   LMP 05/15/2017 (Exact Date)   SpO2 99%   BMI 40.53 kg/m    Wt Readings from Last 3 Encounters:  06/21/17 227 lb (103 kg)  11/03/16 222 lb (100.7 kg)  11/01/16 224 lb 1.9 oz (101.7 kg)    Physical Exam    General:  Well-developed,well-nourished,in no acute distress; alert,appropriate and cooperative throughout  examination Head:  normocephalic and atraumatic.   Eyes:  vision grossly intact, PERRL Ears:  R ear normal and L ear normal externally, TMs clear bilaterally Nose:  no external deformity.   Mouth:  good dentition.   Neck:  No deformities, masses, or tenderness noted. Breasts:  No mass, nodules, thickening, tenderness, bulging, retraction, inflamation, nipple discharge or  skin changes noted.   Lungs:  Normal respiratory effort, chest expands symmetrically. Lungs are clear to auscultation, no crackles or wheezes. Heart:  Normal rate and regular rhythm. S1 and S2 normal without gallop, murmur, click, rub or other extra sounds. Abdomen:  Bowel sounds positive,abdomen soft and non-tender without masses, organomegaly or hernias noted. Rectal:  no external abnormalities.   Genitalia:  Pelvic Exam:        External: normal female genitalia without lesions or masses        Vagina: normal without lesions or masses        Cervix: normal without lesions or masses        Adnexa: normal bimanual exam without masses or fullness        Uterus: normal by palpation        Pap smear: performed Msk:  No deformity or scoliosis noted of thoracic or lumbar spine.   Extremities:  No clubbing, cyanosis, edema, or deformity noted with normal full range of motion of all joints.   Neurologic:  alert & oriented X3 and gait normal.   Skin:  Intact without suspicious lesions or rashes Cervical Nodes:  No lymphadenopathy noted Axillary Nodes:  No palpable lymphadenopathy Psych:  Cognition and judgment appear intact. Alert and cooperative with normal attention span and concentration. No apparent delusions, illusions, hallucinations      Assessment & Plan:   Well woman exam with routine gynecological exam - Plan: Cytology - PAP  Need for Tdap vaccination - Plan: Tdap vaccine greater than or equal to 7yo IM  Vaginal discharge - Plan: Cytology - PAP  Vaginal itching - Plan: Cytology - PAP  Screening for breast  cancer - Plan: MM Digital Screening  Essential hypertension - Plan: CBC with Differential/Platelet, Comprehensive metabolic panel, Lipid panel, TSH No follow-ups on file.

## 2017-06-21 NOTE — Assessment & Plan Note (Signed)
Likely physiological. Will send wet preg, STD testing with pap smear today.

## 2017-06-21 NOTE — Assessment & Plan Note (Signed)
Reviewed preventive care protocols, scheduled due services, and updated immunizations Discussed nutrition, exercise, diet, and healthy lifestyle.  Pap smear done today.  Cologuard ordered.  Orders Placed This Encounter  Procedures  . MM Digital Screening  . Tdap vaccine greater than or equal to 51yo IM  . CBC with Differential/Platelet  . Comprehensive metabolic panel  . Lipid panel  . TSH

## 2017-06-21 NOTE — Patient Instructions (Signed)
Great to see you. I will call you with your lab results from today and you can view them online.   Please schedule your mammogram at your convenience.

## 2017-06-21 NOTE — Addendum Note (Signed)
Addended by: Dianne DunARON, Alee Gressman M on: 06/21/2017 10:24 AM   Modules accepted: Orders

## 2017-06-22 ENCOUNTER — Other Ambulatory Visit: Payer: Self-pay

## 2017-06-22 LAB — CYTOLOGY - PAP
BACTERIAL VAGINITIS: POSITIVE — AB
CANDIDA VAGINITIS: NEGATIVE
CHLAMYDIA, DNA PROBE: NEGATIVE
Diagnosis: NEGATIVE
HPV: NOT DETECTED
Neisseria Gonorrhea: NEGATIVE
Trichomonas: NEGATIVE

## 2017-06-22 MED ORDER — OMEPRAZOLE 20 MG PO CPDR: 20.0000 mg | DELAYED_RELEASE_CAPSULE | Freq: Every day | ORAL | 0 refills | Status: DC

## 2017-06-23 ENCOUNTER — Other Ambulatory Visit: Payer: Self-pay | Admitting: Family Medicine

## 2017-06-23 ENCOUNTER — Encounter: Payer: Self-pay | Admitting: Family Medicine

## 2017-06-23 MED ORDER — METRONIDAZOLE 500 MG PO TABS: 500.0000 mg | ORAL_TABLET | Freq: Two times a day (BID) | ORAL | 0 refills | Status: AC

## 2017-06-23 NOTE — Telephone Encounter (Signed)
Pap with bacteria that causes BV will send in rx for metronidazole.  Otherwise pap is normal.

## 2017-06-26 LAB — CERVICOVAGINAL ANCILLARY ONLY: Herpes: NEGATIVE

## 2017-08-14 ENCOUNTER — Other Ambulatory Visit: Payer: Self-pay | Admitting: Internal Medicine

## 2017-09-18 ENCOUNTER — Other Ambulatory Visit: Payer: Self-pay | Admitting: Internal Medicine

## 2017-10-03 ENCOUNTER — Other Ambulatory Visit: Payer: Self-pay | Admitting: Internal Medicine

## 2017-10-03 NOTE — Telephone Encounter (Signed)
Omeprazole refill denied Pt only seen once in clinic 11/2016 and was supposed to f/u in 3 mos. Nothing further needed.

## 2017-10-04 ENCOUNTER — Other Ambulatory Visit: Payer: Self-pay | Admitting: Internal Medicine

## 2017-11-14 ENCOUNTER — Other Ambulatory Visit: Payer: Self-pay | Admitting: Internal Medicine

## 2017-11-14 NOTE — Telephone Encounter (Signed)
Patient never f/u.

## 2017-11-22 ENCOUNTER — Other Ambulatory Visit: Payer: Self-pay

## 2017-11-22 MED ORDER — OMEPRAZOLE 20 MG PO CPDR: 20.0000 mg | DELAYED_RELEASE_CAPSULE | Freq: Every day | ORAL | 3 refills | Status: DC

## 2018-02-28 ENCOUNTER — Encounter: Payer: Self-pay | Admitting: Family Medicine

## 2018-02-28 ENCOUNTER — Other Ambulatory Visit: Payer: Self-pay

## 2018-02-28 MED ORDER — FLUTICASONE-SALMETEROL 45-21 MCG/ACT IN AERO: 2.0000 | INHALATION_SPRAY | Freq: Two times a day (BID) | RESPIRATORY_TRACT | 3 refills | Status: DC

## 2018-03-16 ENCOUNTER — Telehealth: Payer: Self-pay | Admitting: Family Medicine

## 2018-03-16 NOTE — Telephone Encounter (Signed)
Copied from CRM [id number]. Topic: Quick Communication - Rx Refill/Question >> Mar 16, 2018  8:33 AM Baldo Daub L wrote: Medication: fluticasone-salmeterol (ADVAIR HFA) 45-21 MCG/ACT inhaler  Pt states that medication is too expensive and she wants to know if there is another medication she can take.  Preferred Pharmacy (with phone number or street name): [id number] - [place], [place] - [address] [phone] (Phone) [phone] (Fax)  Agent: Please be advised that RX refills may take up to 3 business days. We ask that you follow-up with your pharmacy.

## 2018-03-16 NOTE — Telephone Encounter (Signed)
TA-Pt states that Advair is too expensive and wants to know if there is an alternative to this? Flovent or Dulera? Plz advise/thx dmf

## 2018-03-18 NOTE — Telephone Encounter (Signed)
We can certainly try either one.  Please check with pt to see which she has tried and what she is willing to try.

## 2018-03-19 NOTE — Telephone Encounter (Signed)
I have sent a MyChart message to the patient requesting her to reach out to her insurance company to see what is a covered alternative to the Advair/thx dmf

## 2018-05-10 ENCOUNTER — Encounter: Payer: Self-pay | Admitting: Family Medicine

## 2018-05-16 ENCOUNTER — Other Ambulatory Visit: Payer: Self-pay

## 2018-05-16 ENCOUNTER — Telehealth: Payer: Self-pay | Admitting: Family Medicine

## 2018-05-16 MED ORDER — TIOTROPIUM BROMIDE MONOHYDRATE 18 MCG IN CAPS: 18.0000 ug | ORAL_CAPSULE | Freq: Every morning | RESPIRATORY_TRACT | 12 refills | Status: DC

## 2018-05-16 MED ORDER — BUDESONIDE-FORMOTEROL FUMARATE 80-4.5 MCG/ACT IN AERO: 2.0000 | INHALATION_SPRAY | Freq: Two times a day (BID) | RESPIRATORY_TRACT | 11 refills | Status: DC

## 2018-05-16 NOTE — Telephone Encounter (Signed)
Copied from CRM [id number]. Topic: Quick Communication - See Telephone Encounter >> May 16, 2018 12:33 PM Trula Slade wrote: CRM for notification. See Telephone encounter for: 05/16/18. [facility] [phone] stated that the patient can no longer get fluticasone-salmeterol (ADVAIR HFA) 45-21 MCG/ACT inhaler at a discounted price anymore.  They can get Symbicort at a reasonable price if the provider will ok it.  Please advise.

## 2018-05-16 NOTE — Addendum Note (Signed)
Addended by: Lerry Liner on: 05/16/2018 03:16 PM   Modules accepted: Orders

## 2018-05-16 NOTE — Telephone Encounter (Signed)
Per TA sent change to pharmacy/thx dmf

## 2018-05-16 NOTE — Telephone Encounter (Signed)
TA-Plz see note below/price of Adviar HFA is now too much but the pharmacy said that they can get Symbicort for the patient at a reasonable price/plz advise if you are in agreement with this change/thx dmf

## 2018-05-16 NOTE — Telephone Encounter (Signed)
Yes I am in agreement with change.  Thank you.

## 2018-05-17 ENCOUNTER — Ambulatory Visit: Payer: Managed Care, Other (non HMO) | Admitting: Family Medicine

## 2018-06-23 ENCOUNTER — Other Ambulatory Visit: Payer: Self-pay

## 2018-06-23 ENCOUNTER — Encounter (HOSPITAL_COMMUNITY): Payer: Self-pay

## 2018-06-23 ENCOUNTER — Emergency Department (HOSPITAL_COMMUNITY)
Admission: EM | Admit: 2018-06-23 | Discharge: 2018-06-23 | Disposition: A | Discharge status: Home / Self Care | Payer: Managed Care, Other (non HMO) | Attending: Emergency Medicine | Admitting: Emergency Medicine

## 2018-06-23 ENCOUNTER — Emergency Department (HOSPITAL_COMMUNITY): Payer: Managed Care, Other (non HMO)

## 2018-06-23 DIAGNOSIS — Y9389 Activity, other specified: Secondary | ICD-10-CM | POA: Diagnosis not present

## 2018-06-23 DIAGNOSIS — I10 Essential (primary) hypertension: Secondary | ICD-10-CM | POA: Insufficient documentation

## 2018-06-23 DIAGNOSIS — M7918 Myalgia, other site: Secondary | ICD-10-CM | POA: Diagnosis not present

## 2018-06-23 DIAGNOSIS — Y999 Unspecified external cause status: Secondary | ICD-10-CM | POA: Insufficient documentation

## 2018-06-23 DIAGNOSIS — S5012XA Contusion of left forearm, initial encounter: Secondary | ICD-10-CM

## 2018-06-23 DIAGNOSIS — Y92410 Unspecified street and highway as the place of occurrence of the external cause: Secondary | ICD-10-CM | POA: Insufficient documentation

## 2018-06-23 DIAGNOSIS — S59912A Unspecified injury of left forearm, initial encounter: Secondary | ICD-10-CM | POA: Diagnosis present

## 2018-06-23 DIAGNOSIS — Z79899 Other long term (current) drug therapy: Secondary | ICD-10-CM | POA: Diagnosis not present

## 2018-06-23 MED ORDER — SILVER SULFADIAZINE 1 % EX CREA
TOPICAL_CREAM | Freq: Once | CUTANEOUS | Status: AC
  Administered 2018-06-23: 1 via TOPICAL
  Filled 2018-06-23: qty 85

## 2018-06-23 MED ORDER — METHOCARBAMOL 500 MG PO TABS: 500.0000 mg | ORAL_TABLET | Freq: Two times a day (BID) | ORAL | 0 refills | Status: DC

## 2018-06-23 MED ORDER — HYDROCODONE-ACETAMINOPHEN 5-325 MG PO TABS
1.0000 | ORAL_TABLET | Freq: Once | ORAL | Status: AC
  Administered 2018-06-23: 18:00:00 1 via ORAL
  Filled 2018-06-23: qty 1

## 2018-06-23 MED ORDER — METHOCARBAMOL 500 MG PO TABS: 500.0000 mg | ORAL_TABLET | Freq: Two times a day (BID) | ORAL | 0 refills | Status: AC

## 2018-06-23 NOTE — Discharge Instructions (Signed)
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
## 2018-06-23 NOTE — ED Notes (Signed)
Silvadene applied to bilateral forearms and bandaged.

## 2018-06-23 NOTE — ED Notes (Signed)
Patient verbalizes understanding of discharge instructions. Opportunity for questioning and answers were provided. Armband removed by staff, pt discharged from ED.  

## 2018-08-22 ENCOUNTER — Encounter: Payer: Self-pay | Admitting: Family Medicine

## 2018-08-22 ENCOUNTER — Other Ambulatory Visit: Payer: Self-pay

## 2018-08-22 MED ORDER — LOSARTAN POTASSIUM-HCTZ 50-12.5 MG PO TABS: 1.0000 | ORAL_TABLET | Freq: Every day | ORAL | 0 refills | Status: DC

## 2018-09-25 ENCOUNTER — Telehealth: Payer: Self-pay

## 2018-09-25 DIAGNOSIS — Z0000 Encounter for general adult medical examination without abnormal findings: Secondary | ICD-10-CM | POA: Insufficient documentation
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
## 2018-09-25 NOTE — Telephone Encounter (Signed)
Questions for Screening COVID-19  Symptom onset: None  Travel or Contacts: None  During this illness, did/does the patient experience any of the following symptoms? Fever >100.53F []   Yes [x]   No []   Unknown Subjective fever (felt feverish) []   Yes [x]   No []   Unknown Chills []   Yes [x]   No []   Unknown Muscle aches (myalgia) []   Yes [x]   No []   Unknown Runny nose (rhinorrhea) []   Yes [x]   No []   Unknown Sore throat []   Yes [x]   No []   Unknown Cough (new onset or worsening of chronic cough) []   Yes [x]   No []   Unknown Shortness of breath (dyspnea) []   Yes [x]   No []   Unknown Nausea or vomiting []   Yes [x]   No []   Unknown Headache []   Yes [x]   No []   Unknown Abdominal pain  []   Yes [x]   No []   Unknown Diarrhea (?3 loose/looser than normal stools/24hr period) []   Yes [x]   No []   Unknown Other, specify:  Patient risk factors: Smoker? []   Current []   Former []   Never If female, currently pregnant? []   Yes []   No  Patient Active Problem List   Diagnosis Date Noted  . Well woman exam without gynecological exam 09/25/2018  . Vaginal discharge 06/21/2017  . Reactive airway disease 11/01/2016  . Allergic rhinitis 11/01/2016  . Well woman exam with routine gynecological exam 05/14/2015  . HTN (hypertension) 03/21/2013  . Severe obesity (BMI >= 40) (HCC) 02/15/2013    Class: Chronic    Plan:  []   High risk for COVID-19 with red flags go to ED (with CP, SOB, weak/lightheaded, or fever > 101.5). Call ahead.  []   High risk for COVID-19 but stable. Inform provider and coordinate time for [facility] visit.   []   No red flags but URI signs or symptoms okay for [facility] visit.

## 2018-09-26 ENCOUNTER — Encounter: Payer: Self-pay | Admitting: Family Medicine

## 2018-09-26 ENCOUNTER — Other Ambulatory Visit: Payer: Self-pay

## 2018-09-26 ENCOUNTER — Other Ambulatory Visit (HOSPITAL_COMMUNITY)
Admission: RE | Admit: 2018-09-26 | Discharge: 2018-09-26 | Disposition: A | Discharge status: Home / Self Care | Payer: Managed Care, Other (non HMO) | Source: Ambulatory Visit | Attending: Family Medicine | Admitting: Family Medicine

## 2018-09-26 ENCOUNTER — Ambulatory Visit (INDEPENDENT_AMBULATORY_CARE_PROVIDER_SITE_OTHER): Payer: Managed Care, Other (non HMO) | Admitting: Family Medicine

## 2018-09-26 VITALS — BP 132/84 | HR 62 | Temp 98.4°F | Ht 63.0 in | Wt 215.8 lb

## 2018-09-26 DIAGNOSIS — Z1151 Encounter for screening for human papillomavirus (HPV): Secondary | ICD-10-CM | POA: Insufficient documentation

## 2018-09-26 DIAGNOSIS — F5102 Adjustment insomnia: Secondary | ICD-10-CM

## 2018-09-26 DIAGNOSIS — I10 Essential (primary) hypertension: Secondary | ICD-10-CM

## 2018-09-26 DIAGNOSIS — Z01411 Encounter for gynecological examination (general) (routine) with abnormal findings: Secondary | ICD-10-CM | POA: Insufficient documentation

## 2018-09-26 DIAGNOSIS — Z23 Encounter for immunization: Secondary | ICD-10-CM

## 2018-09-26 DIAGNOSIS — Z1239 Encounter for other screening for malignant neoplasm of breast: Secondary | ICD-10-CM

## 2018-09-26 DIAGNOSIS — Z01419 Encounter for gynecological examination (general) (routine) without abnormal findings: Secondary | ICD-10-CM

## 2018-09-26 DIAGNOSIS — Z1211 Encounter for screening for malignant neoplasm of colon: Secondary | ICD-10-CM | POA: Diagnosis not present

## 2018-09-26 DIAGNOSIS — R87810 Cervical high risk human papillomavirus (HPV) DNA test positive: Secondary | ICD-10-CM | POA: Diagnosis not present

## 2018-09-26 DIAGNOSIS — Z114 Encounter for screening for human immunodeficiency virus [HIV]: Secondary | ICD-10-CM

## 2018-09-26 LAB — CBC WITH DIFFERENTIAL/PLATELET
Basophils Absolute: 0.1 10*3/uL (ref 0.0–0.1)
Basophils Relative: 1.2 % (ref 0.0–3.0)
Eosinophils Absolute: 0.1 10*3/uL (ref 0.0–0.7)
Eosinophils Relative: 1.7 % (ref 0.0–5.0)
HCT: 40.7 % (ref 36.0–46.0)
Hemoglobin: 13.6 g/dL (ref 12.0–15.0)
Lymphocytes Relative: 21.1 % (ref 12.0–46.0)
Lymphs Abs: 1.3 10*3/uL (ref 0.7–4.0)
MCHC: 33.4 g/dL (ref 30.0–36.0)
MCV: 90.8 fl (ref 78.0–100.0)
Monocytes Absolute: 0.6 10*3/uL (ref 0.1–1.0)
Monocytes Relative: 9.7 % (ref 3.0–12.0)
Neutro Abs: 4.2 10*3/uL (ref 1.4–7.7)
Neutrophils Relative %: 66.3 % (ref 43.0–77.0)
Platelets: 347 10*3/uL (ref 150.0–400.0)
RBC: 4.49 Mil/uL (ref 3.87–5.11)
RDW: 14.1 % (ref 11.5–15.5)
WBC: 6.4 10*3/uL (ref 4.0–10.5)

## 2018-09-26 LAB — COMPREHENSIVE METABOLIC PANEL
ALT: 22 U/L (ref 0–35)
AST: 21 U/L (ref 0–37)
Albumin: 4.5 g/dL (ref 3.5–5.2)
Alkaline Phosphatase: 87 U/L (ref 39–117)
BUN: 16 mg/dL (ref 6–23)
CO2: 29 mEq/L (ref 19–32)
Calcium: 9.9 mg/dL (ref 8.4–10.5)
Chloride: 100 mEq/L (ref 96–112)
Creatinine, Ser: 0.58 mg/dL (ref 0.40–1.20)
GFR: 132.01 mL/min (ref >60.00)
Glucose, Bld: 105 mg/dL — ABNORMAL HIGH (ref 70–99)
Potassium: 4.4 mEq/L (ref 3.5–5.1)
Sodium: 137 mEq/L (ref 135–145)
Total Bilirubin: 0.5 mg/dL (ref 0.2–1.2)
Total Protein: 7.6 g/dL (ref 6.0–8.3)

## 2018-09-26 LAB — TSH: TSH: 2.05 u[IU]/mL (ref 0.35–4.50)

## 2018-09-26 LAB — LIPID PANEL
Cholesterol: 201 mg/dL — ABNORMAL HIGH (ref 0–200)
HDL: 54 mg/dL (ref >39.00)
LDL Cholesterol: 132 mg/dL — ABNORMAL HIGH (ref 0–99)
NonHDL: 147.44
Total CHOL/HDL Ratio: 4
Triglycerides: 76 mg/dL (ref 0.0–149.0)
VLDL: 15.2 mg/dL (ref 0.0–40.0)

## 2018-09-26 MED ORDER — LOSARTAN POTASSIUM-HCTZ 50-12.5 MG PO TABS: 1.0000 | ORAL_TABLET | Freq: Every day | ORAL | 3 refills | Status: AC

## 2018-09-26 MED ORDER — MONTELUKAST SODIUM 10 MG PO TABS: 10.0000 mg | ORAL_TABLET | Freq: Every day | ORAL | 3 refills | Status: AC

## 2018-09-26 MED ORDER — OMEPRAZOLE 20 MG PO CPDR: 20.0000 mg | DELAYED_RELEASE_CAPSULE | Freq: Every day | ORAL | 3 refills | Status: AC

## 2018-09-26 MED ORDER — TRAZODONE HCL 50 MG PO TABS: 25.0000 mg | ORAL_TABLET | Freq: Every evening | ORAL | 3 refills | Status: DC | PRN

## 2018-09-26 NOTE — Patient Instructions (Addendum)
Great to see you. I will call you with your lab results from today and you can view them online.   Call me about a mammography center closer to you and I will fax them an order.   Cologuard will be mailed to your home.  We are starting trazodone 25- 50 mg nighty for sleep.  Please keep me updated.  Congratulations on your weight loss!

## 2018-09-26 NOTE — Assessment & Plan Note (Signed)
Total time spent with patient was 45 minutes, with 25 minutes spent specifically on problem visit concerning Insomnia. Greater than 50 percent of the 25 minutes was spent in counseling the Insomnia.  The problem of recurrent insomnia is discussed. Avoidance of caffeine sources is strongly encouraged. Sleep hygiene issues are reviewed.   eRx sent for trazodone 25-50 mg nightly. Call or send my chart message prn if these symptoms worsen or fail to improve as anticipated.

## 2018-09-26 NOTE — Assessment & Plan Note (Signed)
Well controlled.  No changes made. 

## 2018-09-26 NOTE — Assessment & Plan Note (Addendum)
Reviewed preventive care protocols, scheduled due services, and updated immunizations Discussed nutrition, exercise, diet, and healthy lifestyle.  Discussed USPSTF recommendations of cervical cancer screening.  She is aware that interval of 3 years is recommended but pt would prefer to have pap smear done today.  cologuard order ordered today.  Pt to call me about a mammography center in [place] and we will fax order.  Inlfluenza vaccine given.

## 2018-10-01 ENCOUNTER — Other Ambulatory Visit: Payer: Self-pay | Admitting: Family Medicine

## 2018-10-01 DIAGNOSIS — R87618 Other abnormal cytological findings on specimens from cervix uteri: Secondary | ICD-10-CM

## 2018-10-01 DIAGNOSIS — R8789 Other abnormal findings in specimens from female genital organs: Secondary | ICD-10-CM

## 2018-10-01 LAB — CYTOLOGY - PAP
Adequacy: ABSENT
Chlamydia: NEGATIVE
Diagnosis: NEGATIVE
HPV 16: NEGATIVE
HPV 18 / 45: NEGATIVE
HSV1: NEGATIVE
HSV2: NEGATIVE
High risk HPV: POSITIVE — AB
Molecular Disclaimer: 56
Molecular Disclaimer: NEGATIVE
Molecular Disclaimer: NEGATIVE
Molecular Disclaimer: NEGATIVE
Molecular Disclaimer: NEGATIVE
Molecular Disclaimer: NORMAL
Molecular Disclaimer: NORMAL
Neisseria Gonorrhea: NEGATIVE
Trichomonas: NEGATIVE

## 2018-10-02 ENCOUNTER — Encounter: Payer: Self-pay | Admitting: Family Medicine

## 2018-10-02 ENCOUNTER — Telehealth: Payer: Self-pay

## 2018-10-02 NOTE — Telephone Encounter (Signed)
Copied from [place] [id number]. Topic: General - Other >> Oct 02, 2018  4:23 PM Alanda Slim E wrote: Reason for CRM: Pt spoke with Sharyn Lull earlier about results and the Pt called to speak with her and ask her a question/ please advise

## 2018-10-02 NOTE — Telephone Encounter (Signed)
Sharyn Lull, I talked with pt on the phone. She states she saw your [employer]. She states she found a physician in [place] that she wants to go to she states she is unsure of the practice name but her name is Dr. Almedia Balls. Fax # [phone]

## 2018-10-03 ENCOUNTER — Telehealth: Payer: Self-pay

## 2018-10-03 NOTE — Telephone Encounter (Signed)
Spoke with pt. She is just wanting the referral sent to [facility]. Fax number is [phone]. Message sent to referral coordinator.

## 2018-10-03 NOTE — Telephone Encounter (Signed)
Pt wants referral sent to [facility] instead. Fax number is [phone]. Please let me know if I need to enter a new referral.

## 2018-10-03 NOTE — Telephone Encounter (Signed)
Pt calling back to speak with Sharyn Lull regarding lab results. Please advise

## 2018-10-10 ENCOUNTER — Other Ambulatory Visit: Payer: Self-pay | Admitting: Family Medicine

## 2018-10-10 ENCOUNTER — Other Ambulatory Visit: Payer: Self-pay

## 2018-10-10 DIAGNOSIS — N912 Amenorrhea, unspecified: Secondary | ICD-10-CM

## 2018-10-10 NOTE — Telephone Encounter (Signed)
Please advise on refill.

## 2018-10-10 NOTE — Telephone Encounter (Signed)
Refill sent as requested.  Thank you.

## 2018-10-16 ENCOUNTER — Other Ambulatory Visit: Payer: Managed Care, Other (non HMO)

## 2018-11-08 ENCOUNTER — Encounter: Payer: Self-pay | Admitting: Family Medicine

## 2018-11-08 ENCOUNTER — Other Ambulatory Visit: Payer: Self-pay

## 2018-11-08 MED ORDER — BUDESONIDE-FORMOTEROL FUMARATE 160-4.5 MCG/ACT IN AERO: 1.0000 | INHALATION_SPRAY | Freq: Two times a day (BID) | RESPIRATORY_TRACT | 12 refills | Status: DC

## 2018-11-08 NOTE — Progress Notes (Signed)
Sent in new dosage with new SIG/sent message to pt/thx dmf

## 2018-11-08 NOTE — Progress Notes (Signed)
Okay to send in the cheaper alternative.  Thank you!!

## 2018-11-08 NOTE — Progress Notes (Signed)
TA-Pt ins does not cover the Symbicort at the current dosage/for 2 puffs bid  It will cover the 160/4.5/can this be Rx'ed instead to do 1 puff bid as an alternate? Plz advise/thx dmf

## 2018-11-13 ENCOUNTER — Other Ambulatory Visit: Payer: Self-pay

## 2018-11-13 MED ORDER — BUDESONIDE-FORMOTEROL FUMARATE 160-4.5 MCG/ACT IN AERO: 1.0000 | INHALATION_SPRAY | Freq: Two times a day (BID) | RESPIRATORY_TRACT | 12 refills | Status: DC

## 2018-11-21 ENCOUNTER — Encounter: Payer: Self-pay | Admitting: Family Medicine

## 2018-12-03 ENCOUNTER — Encounter: Payer: Self-pay | Admitting: Family Medicine

## 2018-12-04 NOTE — Telephone Encounter (Signed)
Please see message sent back to pt, 12/03/18.

## 2018-12-07 ENCOUNTER — Encounter: Payer: Self-pay | Admitting: Family Medicine

## 2018-12-10 ENCOUNTER — Other Ambulatory Visit: Payer: Self-pay

## 2018-12-10 MED ORDER — BUDESONIDE-FORMOTEROL FUMARATE 80-4.5 MCG/ACT IN AERO: 2.0000 | INHALATION_SPRAY | Freq: Two times a day (BID) | RESPIRATORY_TRACT | 12 refills | Status: AC

## 2018-12-12 ENCOUNTER — Other Ambulatory Visit: Payer: Self-pay

## 2018-12-12 MED ORDER — BREO ELLIPTA 100-25 MCG/INH IN AEPB: 1.0000 | INHALATION_SPRAY | Freq: Every day | RESPIRATORY_TRACT | 11 refills | Status: AC

## 2018-12-12 NOTE — Progress Notes (Signed)
Per TA if pt willing to try BREO then ok to send in/pt agreed and I sent in/thx dmf

## 2018-12-13 ENCOUNTER — Other Ambulatory Visit: Payer: Self-pay | Admitting: Family Medicine

## 2018-12-13 MED ORDER — FLUTICASONE-SALMETEROL 250-50 MCG/DOSE IN AEPB: 1.0000 | INHALATION_SPRAY | Freq: Two times a day (BID) | RESPIRATORY_TRACT | 3 refills | Status: AC

## 2019-02-14 ENCOUNTER — Encounter: Payer: Self-pay | Admitting: Family Medicine
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
## 2019-09-26 LAB — COLOGUARD
